# Patient Record
Sex: Female | Born: 1955 | Race: White | Hispanic: No | Marital: Married | Smoking: Never smoker
Health system: Southern US, Community
[De-identification: ages and names within clinical notes are randomized; demographics above are authoritative.]

## PROBLEM LIST (undated history)

## (undated) DIAGNOSIS — E785 Hyperlipidemia, unspecified: Secondary | ICD-10-CM

## (undated) DIAGNOSIS — R079 Chest pain, unspecified: Secondary | ICD-10-CM

## (undated) DIAGNOSIS — M8580 Other specified disorders of bone density and structure, unspecified site: Secondary | ICD-10-CM

## (undated) DIAGNOSIS — T7840XA Allergy, unspecified, initial encounter: Secondary | ICD-10-CM

## (undated) DIAGNOSIS — M1990 Unspecified osteoarthritis, unspecified site: Secondary | ICD-10-CM

## (undated) DIAGNOSIS — R011 Cardiac murmur, unspecified: Secondary | ICD-10-CM

## (undated) DIAGNOSIS — C801 Malignant (primary) neoplasm, unspecified: Secondary | ICD-10-CM

## (undated) HISTORY — PX: ABDOMINAL HYSTERECTOMY: SHX81

## (undated) HISTORY — PX: FOOT SURGERY: SHX648

## (undated) HISTORY — DX: Other specified disorders of bone density and structure, unspecified site: M85.80

## (undated) HISTORY — DX: Chest pain, unspecified: R07.9

## (undated) HISTORY — DX: Unspecified osteoarthritis, unspecified site: M19.90

## (undated) HISTORY — PX: MOHS SURGERY: SHX181

## (undated) HISTORY — PX: JOINT REPLACEMENT: SHX530

## (undated) HISTORY — DX: Allergy, unspecified, initial encounter: T78.40XA

## (undated) HISTORY — DX: Malignant (primary) neoplasm, unspecified: C80.1

## (undated) HISTORY — DX: Cardiac murmur, unspecified: R01.1

## (undated) HISTORY — DX: Hyperlipidemia, unspecified: E78.5

---

## 1998-09-09 ENCOUNTER — Ambulatory Visit (HOSPITAL_COMMUNITY): Admission: RE | Admit: 1998-09-09 | Discharge: 1998-09-09 | Payer: Self-pay | Admitting: Gastroenterology

## 1999-05-12 ENCOUNTER — Other Ambulatory Visit: Admission: RE | Admit: 1999-05-12 | Discharge: 1999-05-12 | Payer: Self-pay | Admitting: Gynecology

## 1999-05-12 ENCOUNTER — Encounter (INDEPENDENT_AMBULATORY_CARE_PROVIDER_SITE_OTHER): Payer: Self-pay | Admitting: Specialist

## 2000-09-01 ENCOUNTER — Other Ambulatory Visit: Admission: RE | Admit: 2000-09-01 | Discharge: 2000-09-01 | Payer: Self-pay | Admitting: Gynecology

## 2000-09-01 ENCOUNTER — Encounter (INDEPENDENT_AMBULATORY_CARE_PROVIDER_SITE_OTHER): Payer: Self-pay | Admitting: Specialist

## 2001-05-03 ENCOUNTER — Other Ambulatory Visit: Admission: RE | Admit: 2001-05-03 | Discharge: 2001-05-03 | Payer: Self-pay | Admitting: Gynecology

## 2001-09-12 ENCOUNTER — Other Ambulatory Visit: Admission: RE | Admit: 2001-09-12 | Discharge: 2001-09-12 | Payer: Self-pay | Admitting: Gynecology

## 2002-01-17 ENCOUNTER — Other Ambulatory Visit: Admission: RE | Admit: 2002-01-17 | Discharge: 2002-01-17 | Payer: Self-pay | Admitting: Gynecology

## 2002-04-18 ENCOUNTER — Inpatient Hospital Stay (HOSPITAL_COMMUNITY): Admission: RE | Admit: 2002-04-18 | Discharge: 2002-04-19 | Payer: Self-pay | Admitting: Gynecology

## 2002-04-18 ENCOUNTER — Encounter (INDEPENDENT_AMBULATORY_CARE_PROVIDER_SITE_OTHER): Payer: Self-pay | Admitting: Specialist

## 2003-02-04 ENCOUNTER — Other Ambulatory Visit: Admission: RE | Admit: 2003-02-04 | Discharge: 2003-02-04 | Payer: Self-pay | Admitting: Gynecology

## 2004-03-08 ENCOUNTER — Other Ambulatory Visit: Admission: RE | Admit: 2004-03-08 | Discharge: 2004-03-08 | Payer: Self-pay | Admitting: Gynecology

## 2005-05-12 ENCOUNTER — Other Ambulatory Visit: Admission: RE | Admit: 2005-05-12 | Discharge: 2005-05-12 | Payer: Self-pay | Admitting: Gynecology

## 2006-05-18 ENCOUNTER — Other Ambulatory Visit: Admission: RE | Admit: 2006-05-18 | Discharge: 2006-05-18 | Payer: Self-pay | Admitting: Gynecology

## 2007-06-14 ENCOUNTER — Other Ambulatory Visit: Admission: RE | Admit: 2007-06-14 | Discharge: 2007-06-14 | Payer: Self-pay | Admitting: Gynecology

## 2009-11-25 ENCOUNTER — Emergency Department (HOSPITAL_COMMUNITY): Admission: EM | Admit: 2009-11-25 | Discharge: 2009-11-25 | Payer: Self-pay | Admitting: Emergency Medicine

## 2009-11-26 ENCOUNTER — Emergency Department (HOSPITAL_COMMUNITY): Admission: EM | Admit: 2009-11-26 | Discharge: 2009-11-26 | Payer: Self-pay | Admitting: Emergency Medicine

## 2010-06-10 LAB — CBC
HCT: 40.7 % (ref 36.0–46.0)
Hemoglobin: 14.4 g/dL (ref 12.0–15.0)
MCHC: 35.3 g/dL (ref 30.0–36.0)
MCV: 87.8 fL (ref 78.0–100.0)
RBC: 4.63 MIL/uL (ref 3.87–5.11)
WBC: 8.2 10*3/uL (ref 4.0–10.5)

## 2010-06-10 LAB — COMPREHENSIVE METABOLIC PANEL
ALT: 27 U/L (ref 0–35)
Albumin: 4.4 g/dL (ref 3.5–5.2)
Alkaline Phosphatase: 63 U/L (ref 39–117)
BUN: 7 mg/dL (ref 6–23)
Chloride: 105 mEq/L (ref 96–112)
Glucose, Bld: 113 mg/dL — ABNORMAL HIGH (ref 70–99)
Sodium: 140 mEq/L (ref 135–145)
Total Bilirubin: 0.9 mg/dL (ref 0.3–1.2)
Total Protein: 7.6 g/dL (ref 6.0–8.3)

## 2010-06-10 LAB — DIFFERENTIAL
Basophils Absolute: 0 10*3/uL (ref 0.0–0.1)
Lymphocytes Relative: 12 % (ref 12–46)

## 2010-06-11 LAB — DIFFERENTIAL
Basophils Absolute: 0 10*3/uL (ref 0.0–0.1)
Basophils Relative: 1 % (ref 0–1)
Eosinophils Relative: 2 % (ref 0–5)
Monocytes Relative: 8 % (ref 3–12)
Neutro Abs: 3.3 10*3/uL (ref 1.7–7.7)
Neutrophils Relative %: 51 % (ref 43–77)

## 2010-06-11 LAB — COMPREHENSIVE METABOLIC PANEL
ALT: 33 U/L (ref 0–35)
AST: 29 U/L (ref 0–37)
Albumin: 4.9 g/dL (ref 3.5–5.2)
Alkaline Phosphatase: 66 U/L (ref 39–117)
BUN: 7 mg/dL (ref 6–23)
CO2: 29 mEq/L (ref 19–32)
Calcium: 10.4 mg/dL (ref 8.4–10.5)
Chloride: 103 mEq/L (ref 96–112)
Creatinine, Ser: 0.77 mg/dL (ref 0.4–1.2)
GFR calc Af Amer: >60 mL/min (ref >60)
GFR calc non Af Amer: >60 mL/min (ref >60)
Potassium: 3.6 mEq/L (ref 3.5–5.1)
Sodium: 141 mEq/L (ref 135–145)

## 2010-06-11 LAB — URINALYSIS, ROUTINE W REFLEX MICROSCOPIC
Bilirubin Urine: NEGATIVE
Protein, ur: NEGATIVE mg/dL
Specific Gravity, Urine: 1.004 — ABNORMAL LOW (ref 1.005–1.030)
Urobilinogen, UA: 0.2 mg/dL (ref 0.0–1.0)
pH: 7 (ref 5.0–8.0)

## 2010-06-11 LAB — CBC
HCT: 43.1 % (ref 36.0–46.0)
MCH: 30.9 pg (ref 26.0–34.0)
MCV: 87.9 fL (ref 78.0–100.0)
RBC: 4.9 MIL/uL (ref 3.87–5.11)
RDW: 13.1 % (ref 11.5–15.5)
WBC: 6.4 10*3/uL (ref 4.0–10.5)

## 2010-08-13 NOTE — H&P (Signed)
NAME:  Shannon Vasquez, Shannon Vasquez                     ACCOUNT NO.:  [id number]   MEDICAL RECORD NO.:  [id number]                   PATIENT TYPE:  INP   LOCATION:  0453                                 FACILITY:  [facility]   PHYSICIAN:  Leatha Gilding. Mezer, M.D.               DATE OF BIRTH:  08/29/1955   DATE OF ADMISSION:  04/18/2002  DATE OF DISCHARGE:                                HISTORY & PHYSICAL   ADMISSION DIAGNOSIS:  Menorrhagia.   HISTORY OF PRESENT ILLNESS:  The patient is a 55 year old gravida 2, para 2  female using vasectomy for contraception who is admitted with longstanding  menorrhagia and menometrorrhagia, history of endometrial hyperplasia, and  history of atypical glandular cells on Pap smear, for a total abdominal  hysterectomy and bilateral salpingo-oophorectomy.  The patient has a long  history of menorrhagia with anemia to a hemoglobin of 8.  The patient  underwent a saline ultrasound in 2001.  Polyps were identified.  A  hysteroscopy D&C was performed, and benign proliferative endometrium and  benign endometrial polyp were identified at that time.  Symptoms persisted,  and the patient underwent a second hysteroscopy in June 2002, at which time  the pathology was returned as a disorder proliferative endometrium with  simple focal hyperplasia without atypia.  In February 2003, the patient had  a Pap smear which showed glandular cell abnormality, atypical glandular  cells that were present, and the comment on the Pap smear was atypical  endocervical cells present.  The patient then underwent a colposcopy with  biopsies which returned no dysplasia in the cervical biopsies, and  endocervical curettage revealed no evidence of glandular atypia.  The  patient next had an endometrial biopsy performed in the office which  revealed benign secretory endometrium.  No hyperplasia or malignancy  identified.  At the time of the atypical glandular cells being diagnosed,  the unpredictable  course of atypical glandular cells was stressed.  It was  possibly related to the previous endometrial hyperplasia.  With a history of  cone biopsy years ago hysterectomy was recommended to the patient.  After  careful consultation she decided at that time that she would consent to  hysterectomy if any further glandular cells were found on future Pap smears  with close observation.  The next Pap smear in June 2003 was returned as  within normal limits, as was the Pap smear in December 2003.  The patient  continued to have very painful, heavy menstrual periods and in December  decided to pursue hysterectomy.  A total abdominal hysterectomy and  bilateral salpingo-oophorectomy have been reviewed with the patient in  detail.  Potential complications including but not limited to injury to the  bowel, bladder, ureters, possible fistula formation, possible blood loss  that would require transfusion and possible sequelae, and possible infection  in the wound or in the pelvis have been discussed with the patient.  The  pros and  cons of bilateral salpingo-oophorectomy versus preserving the  ovaries have been discussed with the patient in detail, and she has elected  to proceed with bilateral salpingo-oophorectomy.  The patient is aware of  the possibility of cervical cancer being diagnosed in the pathology  specimen.  The postoperative expectations and restrictions have been  reviewed in detail, and pain control has been discussed.  The patient  understands that there is no guarantee to eliminate her pelvic pain.  The  patient is, therefore, admitted for total abdominal hysterectomy and  bilateral salpingo-oophorectomy.   PAST SURGICAL HISTORY:  1. Cone biopsy.  2. Diagnostic laparoscopy.  3. Hysteroscopy x 2.   PAST MEDICAL HISTORY:  Noncontributory.   MEDICATIONS:  None.   ALLERGIES:  PENICILLIN (rash, and no breathing difficulties).   SOCIAL HISTORY:  Smokes, none.  ETOH, occasional.   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   FAMILY HISTORY:  Positive for skin cancer in the patient's mother.  Uncle  with non-Hodgkin's lymphoma.  An aunt with questionable lymphoma.   PHYSICAL EXAMINATION:  HEENT:  Negative.  HEART:  Without murmurs.  LUNGS:  Clear.  BREASTS:  Without masses or discharge.  ABDOMEN:  Soft and nontender.  PELVIC:  BUS, vagina, and cervix to be normal, with scarring on the cervix  from previous cone biopsy.  The uterus is anterior, approximately eight  weeks in size, and the adnexa are without masses.  EXTREMITIES:  Negative.   IMPRESSION:  1. Menorrhagia.  2. History of atypical glandular cells on Pap smear.  3. History of endometrial hyperplasia.  4. Pelvic pain.  5. History of cone biopsy.   PLAN:  Total abdominal hysterectomy and bilateral salpingo-oophorectomy.                                               Leatha Gilding. Mezer, M.D.    HCM/MEDQ  D:  04/18/2002  T:  04/18/2002  Job:  [id number]   cc:   C. Duane Lope, M.D.  [address]  [place]  [place] [address]  Fax: [phone]

## 2010-08-13 NOTE — Op Note (Signed)
NAME:  Shannon Vasquez, Shannon Vasquez                     ACCOUNT NO.:  [id number]   MEDICAL RECORD NO.:  [id number]                   PATIENT TYPE:  INP   LOCATION:  0453                                 FACILITY:  [facility]   PHYSICIAN:  Leatha Gilding. Mezer, M.D.               DATE OF BIRTH:  1955-12-03   DATE OF PROCEDURE:  04/18/2002  DATE OF DISCHARGE:  04/19/2002                                 OPERATIVE REPORT   PREOPERATIVE DIAGNOSES:  1. Menorrhagia.  2. Atypical glandular cells on Pap smear.  3. Pelvic pain.   POSTOPERATIVE DIAGNOSES:  1. Menorrhagia.  2. Atypical glandular cells on Pap smear.  3. Pelvic pain.  Pending pathology.   OPERATION PERFORMED:  Total abdominal hysterectomy and bilateral salpingo-  oophorectomy.   SURGEON:  Leatha Gilding. Mezer, M.D.   ASSISTANT:  Leona Singleton, M.D.   ANESTHESIA:  General endotracheal.   PREPARATION:  Betadine.   PROCEDURE:  With the patient in the supine position, she was prepped and  draped in routine fashion.  A Pfannenstiel incision was made through the  skin and subcutaneous tissue.  The fascia and peritoneum were opened without  difficulty.  A brief exploration of her upper abdomen was benign.  Exploration of the pelvis revealed the uterus to be somewhat larger than  topical more size.  The ovaries were essentially normal; there were no signs  of endometriosis and no pelvic adhesions.  The round ligaments were suture  ligated with #1 chromic and divided.  The anterior leaf of the broad  ligament was opened.  The infundibulo pelvic ligaments were isolated.  The  ureters were identified bilaterally and found to be out of harm's way.  The  infundibulo pelvic ligaments were clamped, cut and free tied with #1  chromic; and then suture ligated with #1 chromic.  The bladder was  significantly adherent to the uterus and cervix; likely secondary to the  previous cone biopsy.  The bladder was very carefully taken down sharply and  bluntly and  there was no reason for concern for injury to the bladder.  The  uterine artery and vein were then clamped, cut and suture ligated  bilaterally.  The cardinal ligament was taken in several bites, clamped, cut  and suture ligated with #1 chromic.  The uterosacral ligaments were taken  separately and high; clamped, cut and suture ligated with #1 chromic.  The  vagina was entered laterally on the left side, and the specimen excised with  circumferential dissection.  The cervix was quite irregular and great care  was taken to remove the cervix entirely.  The angles of the vagina were then  sutured with TeLinde sutures of #1 chromic, and the cuff was then whipped  anteriorly and posteriorly with running locked #1 chromic suture.  Great  care was taken to ensure adequate space from the bladder.  The vaginal cuff  opening was then approximated with two interrupted #1  chromic sutures.  Careful attention was paid to hemostasis, and there were several bleeders  primarily on the peritoneal edge of the bladder.  After hemostasis was noted  to be intact, the bladder was then placed over the vaginal cuff with a 3-0  Vicryl suture.  The ureters were reinspected and found to be out of harm's  way.  After thorough irrigation and hemostasis being assured, the large  bowel was placed in the cul-de-sac, the omentum was brought down and the  abdomen was closed in layers using a running 2-0 Vicryl suture on the  peritoneum; running 0 Vicryl to the midline bilaterally on the fascia.  Hemostasis was assured in the subcutaneous tissue, and the skin was closed  with staples.  The sponge, needle and instrument counts were correct x2.  The patient tolerated the procedure well and was taken to the recovery room  in satisfactory condition.                                               Leatha Gilding. Mezer, M.D.    HCM/MEDQ  D:  04/22/2002  T:  04/22/2002  Job:  [id number]   cc:   Leona Singleton, M.D.  [address]  [address] [place]  [place] [address]  Fax: [phone]   Miguel Aschoff, M.D.  [address]  [place]  [place]  [address]  Fax: [phone]

## 2013-04-05 ENCOUNTER — Ambulatory Visit (INDEPENDENT_AMBULATORY_CARE_PROVIDER_SITE_OTHER): Payer: BC Managed Care – PPO

## 2013-04-05 ENCOUNTER — Encounter: Payer: Self-pay | Admitting: Podiatrist

## 2013-04-05 ENCOUNTER — Ambulatory Visit (INDEPENDENT_AMBULATORY_CARE_PROVIDER_SITE_OTHER): Payer: BC Managed Care – PPO | Admitting: Podiatrist

## 2013-04-05 VITALS — BP 129/83 | HR 88 | Resp 18

## 2013-04-05 DIAGNOSIS — M722 Plantar fascial fibromatosis: Secondary | ICD-10-CM

## 2013-04-05 DIAGNOSIS — M79609 Pain in unspecified limb: Secondary | ICD-10-CM

## 2013-04-05 MED ORDER — TRIAMCINOLONE ACETONIDE 40 MG/ML IJ SUSP
20.0000 mg | Freq: Once | INTRAMUSCULAR | Status: AC
  Administered 2013-04-05: 20 mg

## 2013-04-05 MED ORDER — MELOXICAM 15 MG PO TABS: 15.0000 mg | ORAL_TABLET | Freq: Every day | ORAL | Status: DC

## 2013-04-05 NOTE — Progress Notes (Signed)
   Subjective:    Patient ID: Shannon Vasquez, female    DOB: 01-28-1956, 58 y.o.   MRN: [id number]  HPI  I have some pain on my right heel and hurts on the bottom of the heel and been going on for about 2 months and my big toe is numb and hurts to wear shoes and I do feel some pulling.  Patient presents today for followup of plantar fasciitis heel pain on the right side. She states the injections for the last visit were beneficial however the heel pain has returned.    Review of Systems  Constitutional: Negative.   HENT: Negative.   Eyes: Negative.   Cardiovascular: Negative.   Gastrointestinal: Negative.   Endocrine: Negative.   Genitourinary: Negative.   Musculoskeletal: Negative.   Skin: Negative.   Allergic/Immunologic: Negative.   Neurological: Negative.   Hematological: Negative.   Psychiatric/Behavioral: Negative.        Objective:   Physical Exam  Her vascular status is intact and unchanged from the last visit. Pain on palpation plantar medial aspect is present consistent with plantar fasciitis and bursitis. Neurological sensation is intact and negative Tinel sign is elicited. Musculoskeletal examination reveals good muscle strength, tone, stability bilateral. Rectus foot type is noted.      Assessment & Plan:  Plantar fasciitis right Discussed etiology, pathology, conservative vs. Surgical therapies and at this time a plantar fascial injection was recommended.  The patient agreed and a sterile skin prep was applied.  An injection consisting of kenalog and marcaine mixture was infiltrated at the point of maximal tenderness on the right Heel.  The patient tolerated this well and was given instructions for aftercare.

## 2013-04-05 NOTE — Patient Instructions (Signed)

## 2013-04-09 ENCOUNTER — Telehealth: Payer: Self-pay | Admitting: *Deleted

## 2013-04-09 NOTE — Telephone Encounter (Signed)
Patient returned my call. I informed her that Dr. Valentina Lucks prescribed her a prescription for Mobic 15mg .  I advised she can come by to pick it up.  She requested that I mail the prescription to her because she lives too far away.

## 2013-04-09 NOTE — Telephone Encounter (Signed)
I left the patient a message to call me back about her prescription.  Patient did not receive her prescription that was printed on 04/05/13 during her visit.

## 2016-01-04 ENCOUNTER — Encounter: Payer: BC Managed Care – PPO | Attending: Family Medicine | Admitting: Dietician

## 2016-01-04 DIAGNOSIS — Z713 Dietary counseling and surveillance: Secondary | ICD-10-CM | POA: Insufficient documentation

## 2016-01-04 DIAGNOSIS — E785 Hyperlipidemia, unspecified: Secondary | ICD-10-CM

## 2016-01-04 NOTE — Patient Instructions (Signed)
Try Premier Protein shake for meal with fruit.  Try adding some vegetables to lunch meal (salad or raw vegetables). For protein options try:  Tempeh   Celebration Roast Dietitian)  Garden (Chicken Scallopini)   Target brand vegetarian meats  Beyond meat (burger)  Try Better than Boullion for seasoning.

## 2016-01-04 NOTE — Progress Notes (Signed)
  Medical Nutrition Therapy:  Appt start time: [id number] end time:  1715.   Assessment:  Primary concerns today: Shannon Vasquez is here today since she has been trying to lose weight and reduce cholesterol levels. Has been taking Crestor for about 4 years. Weight has been slowly going up each year. Would like to lose 25 lbs.Has not been eating meat for health reasons since May.   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 States that he does some of the shopping and she does the meal preparation. She does not skip meals as much during the summer as during the school year. Eats small meals when at work. Is really busy with school year and struggling with time management. Eats out about 2 x week.     Struggling with ideas for meals. Still fixes meat for husband.   When she is at work she is not very hungry but she is busy. Feels "starving" when she gets home.  Preferred Learning Style:   No preference indicated   Learning Readiness:   Ready  MEDICATIONS: see list   DIETARY INTAKE:  Usual eating pattern includes 3 meals and 2 snacks per day.  Avoided foods include: meats  24-hr recall:  B ( AM): 1/2 peanut butter and jelly or sometimes biscuits and gravy or eggs on weekends  Snk ( AM): none L ( PM): 1/2 peanut butter and jelly, on Sunday Indian food on weekend (large meal) Snk ( PM): fig bar with coffee  D ( PM): fried fish or bean burritos with vegetables, soups or cheese and bread on Sunday Snk ( PM): popcorn or cereal Beverages: water with lemon and diet soda, and black coffee   Usual physical activity: walks at work for job  Estimated energy needs: 1200-1400 calories 135-158 g carbohydrates 90-105 g protein 33-39 g fat  Progress Towards Goal(s):  In progress.   Nutritional Diagnosis:  NB-1.1 Food and nutrition-related knowledge deficit As related to unstructured meals/large portions at night.  As evidenced by diet recall.    Intervention:   Nutrition counseling provided. Plan: Try Premier Protein shake for meal with fruit.  Try adding some vegetables to lunch meal (salad or raw vegetables). For protein options try:  Tempeh   Celebration Roast Dietitian)  Garden (Chicken Scallopini)   Target brand vegetarian meats  Beyond meat (burger)  Try Better than Boullion for seasoning.  Teaching Method Utilized:  Visual Auditory Hands on  Handouts given during visit include:  Vegetarian Proteins  Meal Card  15 g CHO Snacks  Barriers to learning/adherence to lifestyle change: busy work days/not much time to eat  Demonstrated degree of understanding via:  Teach Back   Monitoring/Evaluation:  Dietary intake, exercise, and body weight prn.

## 2016-04-03 ENCOUNTER — Emergency Department (HOSPITAL_COMMUNITY): Payer: BC Managed Care – PPO

## 2016-04-03 ENCOUNTER — Encounter (HOSPITAL_COMMUNITY): Payer: Self-pay

## 2016-04-03 ENCOUNTER — Emergency Department (HOSPITAL_COMMUNITY)
Admission: EM | Admit: 2016-04-03 | Discharge: 2016-04-03 | Disposition: A | Discharge status: Home / Self Care | Payer: BC Managed Care – PPO | Attending: Emergency Medicine | Admitting: Emergency Medicine

## 2016-04-03 DIAGNOSIS — H9202 Otalgia, left ear: Secondary | ICD-10-CM | POA: Diagnosis not present

## 2016-04-03 DIAGNOSIS — H9201 Otalgia, right ear: Secondary | ICD-10-CM

## 2016-04-03 DIAGNOSIS — R079 Chest pain, unspecified: Secondary | ICD-10-CM

## 2016-04-03 LAB — I-STAT TROPONIN, ED
TROPONIN I, POC: 0 ng/mL (ref 0.00–0.08)
Troponin i, poc: 0 ng/mL (ref 0.00–0.08)

## 2016-04-03 LAB — CBC
HCT: 39.4 % (ref 36.0–46.0)
Hemoglobin: 13 g/dL (ref 12.0–15.0)
MCH: 27.7 pg (ref 26.0–34.0)
MCHC: 33 g/dL (ref 30.0–36.0)
MCV: 84 fL (ref 78.0–100.0)
PLATELETS: 324 10*3/uL (ref 150–400)
RBC: 4.69 MIL/uL (ref 3.87–5.11)
RDW: 13.2 % (ref 11.5–15.5)
WBC: 4.9 10*3/uL (ref 4.0–10.5)

## 2016-04-03 LAB — BASIC METABOLIC PANEL
Anion gap: 8 (ref 5–15)
BUN: 14 mg/dL (ref 6–20)
CALCIUM: 9.2 mg/dL (ref 8.9–10.3)
CHLORIDE: 107 mmol/L (ref 101–111)
CO2: 25 mmol/L (ref 22–32)
CREATININE: 0.59 mg/dL (ref 0.44–1.00)
Glucose, Bld: 103 mg/dL — ABNORMAL HIGH (ref 65–99)
Potassium: 4 mmol/L (ref 3.5–5.1)
SODIUM: 140 mmol/L (ref 135–145)

## 2016-04-03 MED ORDER — IOPAMIDOL (ISOVUE-370) INJECTION 76%
INTRAVENOUS | Status: AC
  Administered 2016-04-03: 100 mL
  Filled 2016-04-03: qty 100

## 2016-04-03 NOTE — Discharge Instructions (Signed)
The cause of your symptoms was not identified today. Please follow up with your family doctor for recheck. Please get rechecked immediately if you develop any new or worrisome symptoms.    EXAM: CT ANGIOGRAPHY CHEST, ABDOMEN AND PELVIS   TECHNIQUE: Initially, axial CT images were obtained through the chest without intravenous contrast material administration. Multidetector CT imaging through the chest, abdomen and pelvis was performed using the standard protocol during bolus administration of intravenous contrast. Multiplanar reconstructed images and MIPs were obtained and reviewed to evaluate the vascular anatomy.   CONTRAST:  100 mL Isovue 370 nonionic   COMPARISON:  Chest radiograph April 03, 2016   FINDINGS: CTA CHEST FINDINGS   Cardiovascular: No intramural hematoma is seen in the thoracic region on noncontrast enhanced study. There is no evident thoracic aortic aneurysm or dissection. The visualized great vessels appear unremarkable. The pericardium is not appreciably thickened. There is no evident pulmonary embolus.   Mediastinum/Nodes: Visualized thyroid appears unremarkable. There is no appreciable thoracic adenopathy.   Lungs/Pleura: There is minimal atelectatic change in the anterior left base. There is no edema or consolidation. No appreciable pleural effusion or pleural thickening.   Musculoskeletal: There are no blastic or lytic bone lesions. There is slight mid thoracic dextroscoliosis.   Review of the MIP images confirms the above findings.   CTA ABDOMEN AND PELVIS FINDINGS   VASCULAR   Aorta: There is no appreciable thoracic aortic aneurysm or dissection. There is mild atherosclerotic calcification in the mid the distal aorta. No appreciable narrowing of the aorta is evident.   Celiac: Celiac axis and its branches are widely patent. No aneurysm or dissection evident.   SMA: Superior mesenteric artery and its major branches are widely patent. No  aneurysm or dissection.   Renals: Single renal arteries are apparent bilaterally. No aneurysm or dissection. Renal arteries appear widely patent without focal lesion on either side.   IMA: Inferior mesenteric artery and its major branches appear widely patent. No aneurysm or dissection evident.   Inflow: The pelvic arterial vessels show no aneurysm or dissection. There is mild calcification in the distal common iliac arteries bilaterally. There is also slight calcification in each hypogastric artery. There is no appreciable vessel narrowing.   Veins: No apparent venous abnormality within the limitations of this arterial phase study.   Review of the MIP images confirms the above findings.   NON-VASCULAR   Hepatobiliary: Liver measures 18.3 cm in length. There is a degree of hepatic steatosis. No focal liver lesions are demonstrable. Gallbladder wall is not appreciably thickened. There is no biliary duct dilatation.   Pancreas: No pancreatic mass or inflammatory focus.   Spleen: No splenic lesions are evident.   Adrenals/Urinary Tract: Adrenals appear within normal limits bilaterally. There is fetal lobulation in each kidney. There is no appreciable renal mass or hydronephrosis on either side. There is no renal or ureteral calculus on either side. Urinary bladder is midline with wall thickness within normal limits.   Stomach/Bowel: There are scattered sigmoid diverticula without diverticulitis. Rectum is slightly distended with stool. There is no appreciable bowel wall or mesenteric thickening. No bowel obstruction. No free air or portal venous air.   Lymphatic: There is no evident adenopathy in the abdomen or pelvis.   Reproductive: Uterus is absent. There is no pelvic mass or pelvic fluid collection.   Other: Appendix appears normal. No abscess or ascites in the abdomen or pelvis. There is a minimal ventral hernia containing only fat.   Musculoskeletal: There  are no  blastic or lytic bone lesions. No intramuscular or abdominal wall lesion.   Review of the MIP images confirms the above findings.   IMPRESSION: CT angiogram chest: No demonstrable thoracic aortic aneurysm or dissection. No pulmonary embolus. No lung edema or consolidation. No evident adenopathy.   CT angiogram abdomen and pelvis: There is mild aortoiliac atherosclerotic calcification. No appreciable abdominal or pelvic arterial vessel narrowing. No aneurysm or dissection.   Mildly prominent liver with hepatic steatosis. Uterus absent. Minimal ventral hernia containing only fat.   Scattered sigmoid diverticuli diverticulitis. No bowel wall thickening or bowel obstruction. No abscess. Appendix appears normal.

## 2016-04-03 NOTE — ED Triage Notes (Signed)
Pt states she was at church. States she started having pain in rt ear.  Pain is moving.  Now it is in her back.  Feels like it is in the "middle of her body".  Radiates to chest.  Now pain in both ears. Felt fine prior to going to church.  No shortness of breath.

## 2016-04-03 NOTE — ED Provider Notes (Signed)
[facility] DEPT Provider Note   CSN: [id number] Arrival date & time: 04/03/16  1150     History   Chief Complaint Chief Complaint  Patient presents with  . Otalgia  . Chest Pain    HPI Shannon Vasquez is a 61 y.o. female.  The history is provided by the patient. No language interpreter was used.  Otalgia   Chest Pain      Shannon Vasquez is a 61 y.o. female who presents to the Emergency Department complaining of chest pain.  She was sitting in church today when she developed pain that was severe and burning to her right neck. The pain radiated down to her back and wrapped around her right chest. Followed by severe diffuse chest burning. The severe nature of her pain lasted about 10 minutes and migrated over the course of 10 minutes. After that the pain gradually resolved. No associated fevers, headaches, cough, shortness of breath, abdominal pain, nausea, vomiting, numbness, weakness. She currently has no pain. She has a history of hyperlipidemia. No history of blood clots and she does not take any hormones. No recent surgeries. She has a family history of hypertension and peripheral vascular disease.  Past Medical History:  Diagnosis Date  . Allergy   . Arthritis   . Cancer ([place])    carcinoma  . Heart murmur   . Hyperlipidemia     There are no active problems to display for this patient.   Past Surgical History:  Procedure Laterality Date  . ABDOMINAL HYSTERECTOMY    . FOOT SURGERY    . MOHS SURGERY     on lip    OB History    No data available       Home Medications    Prior to Admission medications   Medication Sig Start Date End Date Taking? Authorizing Provider  diphenhydrAMINE (BENADRYL) 25 MG tablet Take 25 mg by mouth every 6 (six) hours as needed. Does the benadryl every day and  1 1/2 tablets and doesn't know the mg/lc    Historical Provider, MD  meloxicam (MOBIC) 15 MG tablet Take 1 tablet (15 mg total) by mouth daily. 04/05/13   Bronson Ing, DPM  montelukast (SINGULAIR) 10 MG tablet  03/25/13   Historical Provider, MD  rosuvastatin (CRESTOR) 10 MG tablet Take 10 mg by mouth daily.    Historical Provider, MD    Family History History reviewed. No pertinent family history.  Social History Social History  Substance Use Topics  . Smoking status: Never Smoker  . Smokeless tobacco: Never Used  . Alcohol use Yes     Allergies   Penicillins   Review of Systems Review of Systems  HENT: Positive for ear pain.   Cardiovascular: Positive for chest pain.  All other systems reviewed and are negative.    Physical Exam Updated Vital Signs BP 142/92   Pulse 77   Temp 97.8 F (36.6 C) (Oral)   Resp 16   Ht 5\' 4"  (1.626 m)   Wt 156 lb (70.8 kg)   SpO2 95%   BMI 26.78 kg/m   Physical Exam  Constitutional: She is oriented to person, place, and time. She appears well-developed and well-nourished. No distress.  HENT:  Head: Normocephalic and atraumatic.  Right Ear: External ear normal.  Left Ear: External ear normal.  Neck: Neck supple.  Cardiovascular: Normal rate and regular rhythm.   No murmur heard. Pulmonary/Chest: Effort normal and breath sounds normal. No respiratory distress.  Abdominal: Soft. There is no tenderness. There is no rebound and no guarding.  Musculoskeletal: She exhibits no edema or tenderness.  2+ radial pulses bilaterally  Lymphadenopathy:    She has no cervical adenopathy.  Neurological: She is alert and oriented to person, place, and time.  Skin: Skin is warm and dry.  Psychiatric: She has a normal mood and affect. Her behavior is normal.  Nursing note and vitals reviewed.    ED Treatments / Results  Labs (all labs ordered are listed, but only abnormal results are displayed) Labs Reviewed  BASIC METABOLIC PANEL - Abnormal; Notable for the following:       Result Value   Glucose, Bld 103 (*)    All other components within normal limits  CBC  I-STAT TROPOININ, ED    I-STAT TROPOININ, ED    EKG  EKG Interpretation  Date/Time:  Sunday April 03 2016 12:05:05 EST Ventricular Rate:  71 PR Interval:    QRS Duration: 87 QT Interval:  402 QTC Calculation: 437 R Axis:   -5 Text Interpretation:  Sinus rhythm Low voltage, precordial leads Consider anterior infarct Confirmed by Hazle Coca [phone]) on 04/03/2016 3:56:11 PM       Radiology Dg Chest 2 View  Result Date: 04/03/2016 CLINICAL DATA:  Chest pain, initial encounter EXAM: CHEST  2 VIEW COMPARISON:  None. FINDINGS: The heart size and mediastinal contours are within normal limits. Both lungs are clear. The visualized skeletal structures are unremarkable. IMPRESSION: No active cardiopulmonary disease. Electronically Signed   By: Inez Catalina M.D.   On: 04/03/2016 12:36   Ct Angio Chest/abd/pel For Dissection W And/or W/wo  Result Date: 04/03/2016 CLINICAL DATA:  Chest and abdominal pain radiating to back region EXAM: CT ANGIOGRAPHY CHEST, ABDOMEN AND PELVIS TECHNIQUE: Initially, axial CT images were obtained through the chest without intravenous contrast material administration. Multidetector CT imaging through the chest, abdomen and pelvis was performed using the standard protocol during bolus administration of intravenous contrast. Multiplanar reconstructed images and MIPs were obtained and reviewed to evaluate the vascular anatomy. CONTRAST:  100 mL Isovue 370 nonionic COMPARISON:  Chest radiograph April 03, 2016 FINDINGS: CTA CHEST FINDINGS Cardiovascular: No intramural hematoma is seen in the thoracic region on noncontrast enhanced study. There is no evident thoracic aortic aneurysm or dissection. The visualized great vessels appear unremarkable. The pericardium is not appreciably thickened. There is no evident pulmonary embolus. Mediastinum/Nodes: Visualized thyroid appears unremarkable. There is no appreciable thoracic adenopathy. Lungs/Pleura: There is minimal atelectatic change in the anterior left  base. There is no edema or consolidation. No appreciable pleural effusion or pleural thickening. Musculoskeletal: There are no blastic or lytic bone lesions. There is slight mid thoracic dextroscoliosis. Review of the MIP images confirms the above findings. CTA ABDOMEN AND PELVIS FINDINGS VASCULAR Aorta: There is no appreciable thoracic aortic aneurysm or dissection. There is mild atherosclerotic calcification in the mid the distal aorta. No appreciable narrowing of the aorta is evident. Celiac: Celiac axis and its branches are widely patent. No aneurysm or dissection evident. SMA: Superior mesenteric artery and its major branches are widely patent. No aneurysm or dissection. Renals: Single renal arteries are apparent bilaterally. No aneurysm or dissection. Renal arteries appear widely patent without focal lesion on either side. IMA: Inferior mesenteric artery and its major branches appear widely patent. No aneurysm or dissection evident. Inflow: The pelvic arterial vessels show no aneurysm or dissection. There is mild calcification in the distal common iliac arteries bilaterally. There is also slight  calcification in each hypogastric artery. There is no appreciable vessel narrowing. Veins: No apparent venous abnormality within the limitations of this arterial phase study. Review of the MIP images confirms the above findings. NON-VASCULAR Hepatobiliary: Liver measures 18.3 cm in length. There is a degree of hepatic steatosis. No focal liver lesions are demonstrable. Gallbladder wall is not appreciably thickened. There is no biliary duct dilatation. Pancreas: No pancreatic mass or inflammatory focus. Spleen: No splenic lesions are evident. Adrenals/Urinary Tract: Adrenals appear within normal limits bilaterally. There is fetal lobulation in each kidney. There is no appreciable renal mass or hydronephrosis on either side. There is no renal or ureteral calculus on either side. Urinary bladder is midline with wall  thickness within normal limits. Stomach/Bowel: There are scattered sigmoid diverticula without diverticulitis. Rectum is slightly distended with stool. There is no appreciable bowel wall or mesenteric thickening. No bowel obstruction. No free air or portal venous air. Lymphatic: There is no evident adenopathy in the abdomen or pelvis. Reproductive: Uterus is absent. There is no pelvic mass or pelvic fluid collection. Other: Appendix appears normal. No abscess or ascites in the abdomen or pelvis. There is a minimal ventral hernia containing only fat. Musculoskeletal: There are no blastic or lytic bone lesions. No intramuscular or abdominal wall lesion. Review of the MIP images confirms the above findings. IMPRESSION: CT angiogram chest: No demonstrable thoracic aortic aneurysm or dissection. No pulmonary embolus. No lung edema or consolidation. No evident adenopathy. CT angiogram abdomen and pelvis: There is mild aortoiliac atherosclerotic calcification. No appreciable abdominal or pelvic arterial vessel narrowing. No aneurysm or dissection. Mildly prominent liver with hepatic steatosis. Uterus absent. Minimal ventral hernia containing only fat. Scattered sigmoid diverticuli diverticulitis. No bowel wall thickening or bowel obstruction. No abscess. Appendix appears normal. Electronically Signed   By: Lowella Grip III M.D.   On: 04/03/2016 16:49    Procedures Procedures (including critical care time)  Medications Ordered in ED Medications  iopamidol (ISOVUE-370) 76 % injection (100 mLs  Contrast Given 04/03/16 1625)     Initial Impression / Assessment and Plan / ED Course  I have reviewed the triage vital signs and the nursing notes.  Pertinent labs & imaging results that were available during my care of the patient were reviewed by me and considered in my medical decision making (see chart for details).  Clinical Course     Patient here following an episode of chest pain and lateral neck pain,  resolved in the emergency department. She is in no distress. Current clinical picture is not consistent with ACS, PE, CHF, pneumonia. No evidence of dissection. Discussed with patient unclear source of symptoms. Recommend outpatient follow-up as well as close return precautions.  Final Clinical Impressions(s) / ED Diagnoses   Final diagnoses:  Right ear pain  Chest pain, unspecified type    New Prescriptions Discharge Medication List as of 04/03/2016  5:29 PM       Quintella Reichert, MD 04/04/16 [phone]

## 2016-04-03 NOTE — ED Notes (Signed)
Patient transported to CT 

## 2020-06-12 ENCOUNTER — Telehealth: Payer: Self-pay

## 2020-06-12 NOTE — Telephone Encounter (Signed)
Referral notes sent from [facility], Dr. Barkley Bruns office, Phone #: [phone], Fax #: [phone]   Notes sent to scheduling

## 2020-06-16 ENCOUNTER — Other Ambulatory Visit: Payer: Self-pay | Admitting: Obstetrics & Gynecology

## 2020-06-16 DIAGNOSIS — N644 Mastodynia: Secondary | ICD-10-CM

## 2020-08-11 ENCOUNTER — Ambulatory Visit: Payer: BC Managed Care – PPO | Admitting: Cardiology

## 2020-08-11 ENCOUNTER — Other Ambulatory Visit: Payer: Self-pay

## 2020-08-11 ENCOUNTER — Encounter: Payer: Self-pay | Admitting: Cardiology

## 2020-08-11 VITALS — BP 130/80 | HR 84 | Ht 64.0 in | Wt 155.0 lb

## 2020-08-11 DIAGNOSIS — E782 Mixed hyperlipidemia: Secondary | ICD-10-CM

## 2020-08-11 DIAGNOSIS — Z8249 Family history of ischemic heart disease and other diseases of the circulatory system: Secondary | ICD-10-CM | POA: Diagnosis not present

## 2020-08-11 DIAGNOSIS — R079 Chest pain, unspecified: Secondary | ICD-10-CM | POA: Diagnosis not present

## 2020-08-11 NOTE — Patient Instructions (Signed)
Medication Instructions:  The current medical regimen is effective;  continue present plan and medications.  *If you need a refill on your cardiac medications before your next appointment, please call your pharmacy*  Testing/Procedures: Your physician has requested that you have an echocardiogram. Echocardiography is a painless test that uses sound waves to create images of your heart. It provides your doctor with information about the size and shape of your heart and how well your heart's chambers and valves are working. This procedure takes approximately one hour. There are no restrictions for this procedure.  Your physician has requested that you have Coronary Calcium score which is completed by  CT. Cardiac computed tomography (CT) is a painless test that uses an x-ray machine to take clear, detailed pictures of your heart. This testing is completed here at this office.  There are no prior instructions.  The cost of the testing is $99 and due at the time of the procedure.  Follow-Up: At [facility], you and your health needs are our priority.  As part of our continuing mission to provide you with exceptional heart care, we have created designated Provider Care Teams.  These Care Teams include your primary Cardiologist (physician) and Advanced Practice Providers (APPs -  Physician Assistants and Nurse Practitioners) who all work together to provide you with the care you need, when you need it.  We recommend signing up for the patient portal called "MyChart".  Sign up information is provided on this After Visit Summary.  MyChart is used to connect with patients for Virtual Visits (Telemedicine).  Patients are able to view lab/test results, encounter notes, upcoming appointments, etc.  Non-urgent messages can be sent to your provider as well.   To learn more about what you can do with MyChart, go to [email or web address].    Your next appointment:   1 year(s)  The format for your next  appointment:   In Person  Provider:   Candee Furbish, MD   Thank you for choosing [facility]!!

## 2020-08-11 NOTE — Progress Notes (Signed)
Cardiology Office Note:    Date:  08/11/2020   ID:  Shannon Vasquez, DOB 1955/08/02, MRN [id number]  PCP:  Lawerance Cruel, MD   [facility] Providers Cardiologist:  None     Referring MD: Linda Hedges, DO     History of Present Illness:    Shannon Vasquez is a 65 y.o. female here for evaluation of chest pain at the request of Dr. Linda Hedges.  In review of outside office notes, complained of chest and upper back pain for about 3 weeks. Not a problem lately. Felt some heaviness. Back she thinks is more MSK. Feels better. Mild shortness of breath. Weight loss helped.   Mother has PAD, CVA  Feels better off Crestor.   We see her husband Dominica Severin  Past Medical History:  Diagnosis Date  . Allergy   . Arthritis   . Arthritis   . Cancer ([place])    carcinoma  . Chest pain   . Heart murmur   . Hyperlipidemia   . Osteopenia     Past Surgical History:  Procedure Laterality Date  . ABDOMINAL HYSTERECTOMY    . FOOT SURGERY    . MOHS SURGERY     on lip    Current Medications: Current Meds  Medication Sig  . Cholecalciferol (VITAMIN D3) 25 MCG (1000 UT) CAPS Take 1 capsule by mouth daily.  Marland Kitchen ibuprofen (ADVIL) 200 MG tablet Take 1 tablet by mouth 3 times/day as needed-between meals & bedtime.  . Multiple Vitamin (ONE DAILY) tablet Take 1 tablet by mouth daily.  Marland Kitchen NEXLIZET 180-10 MG TABS Take 1 tablet by mouth daily.     Allergies:   Penicillins   Social History   Socioeconomic History  . Marital status: Married    Spouse name: Not on file  . Number of children: Not on file  . Years of education: Not on file  . Highest education level: Not on file  Occupational History  . Not on file  Tobacco Use  . Smoking status: Never Smoker  . Smokeless tobacco: Never Used  Substance and Sexual Activity  . Alcohol use: Yes  . Drug use: No  . Sexual activity: Not on file  Other Topics Concern  . Not on file  Social History Narrative  . Not on file   Social  Determinants of Health   Financial Resource Strain: Not on file  Food Insecurity: Not on file  Transportation Needs: Not on file  Physical Activity: Not on file  Stress: Not on file  Social Connections: Not on file     Family History: The patient's family history includes Arthritis in her father, maternal grandfather, maternal grandmother, mother, paternal grandfather, and paternal grandmother; Heart disease in her maternal grandfather and paternal grandfather; Other in her paternal grandmother; Thyroid disease in her mother.  ROS:   Please see the history of present illness.     All other systems reviewed and are negative.  EKGs/Labs/Other Studies Reviewed:    The following studies were reviewed today:   EKG:  EKG is  ordered today.  The ekg ordered today demonstrates sinus rhythm 84, left axis deviation  Recent Labs: No results found for requested labs within last 8760 hours.  Recent Lipid Panel No results found for: CHOL, TRIG, HDL, CHOLHDL, VLDL, LDLCALC, LDLDIRECT   Risk Assessment/Calculations:      Physical Exam:    VS:  BP 130/80 (BP Location: Left Arm, Patient Position: Sitting, Cuff Size: Normal)   Pulse  84   Ht 5\' 4"  (1.626 m)   Wt 155 lb (70.3 kg)   SpO2 96%   BMI 26.61 kg/m     Wt Readings from Last 3 Encounters:  08/11/20 155 lb (70.3 kg)  04/03/16 156 lb (70.8 kg)  01/04/16 160 lb 8 oz (72.8 kg)     GEN:  Well nourished, well developed in no acute distress HEENT: Normal NECK: No JVD; No carotid bruits LYMPHATICS: No lymphadenopathy CARDIAC: RRR, 1/6 SM no rubs, gallops RESPIRATORY:  Clear to auscultation without rales, wheezing or rhonchi  ABDOMEN: Soft, non-tender, non-distended MUSCULOSKELETAL:  No edema; No deformity  SKIN: Warm and dry NEUROLOGIC:  Alert and oriented x 3 PSYCHIATRIC:  Normal affect   ASSESSMENT:    1. Chest pain of uncertain etiology   2. Family history of early CAD   3. Mixed hyperlipidemia    PLAN:    In order  of problems listed above:  Chest pain - Currently, she is not having any further chest discomfort.  Feels better.  Her back pain most likely was musculoskeletal and has resolved with some exercises and stretching.  Obviously if symptoms were to return or become more worrisome we would have a low threshold for further testing such as stress test.  Family history of PAD, CAD - We will go ahead and check a coronary calcium score, $99 test.  If significant coronary artery calcification is present, this may change medication management or warrant further testing.  Heart murmur - 1/6 systolic murmur heard right upper sternal border.  We will check echocardiogram.  Several years ago she was told that she has a murmur.  Hyperlipidemia - Was on Crestor 10 mg once a day.  Since she was having some muscle discomfort, she stopped this medication.  Recently changed to Nexlizet 180 mg - 10 mg which is bempedoic acid and Zetia.  Tolerating this well.  LDL on 06/03/2020 was 128.  Creatinine 0.8 hemoglobin 14.0 ALT 43 TSH 2.3   1 yr f/u  Medication Adjustments/Labs and Tests Ordered: Current medicines are reviewed at length with the patient today.  Concerns regarding medicines are outlined above.  Orders Placed This Encounter  Procedures  . CT CARDIAC SCORING (SELF PAY ONLY)  . EKG 12-Lead  . ECHOCARDIOGRAM COMPLETE   No orders of the defined types were placed in this encounter.   Patient Instructions  Medication Instructions:  The current medical regimen is effective;  continue present plan and medications.  *If you need a refill on your cardiac medications before your next appointment, please call your pharmacy*  Testing/Procedures: Your physician has requested that you have an echocardiogram. Echocardiography is a painless test that uses sound waves to create images of your heart. It provides your doctor with information about the size and shape of your heart and how well your heart's chambers and  valves are working. This procedure takes approximately one hour. There are no restrictions for this procedure.  Your physician has requested that you have Coronary Calcium score which is completed by  CT. Cardiac computed tomography (CT) is a painless test that uses an x-ray machine to take clear, detailed pictures of your heart. This testing is completed here at this office.  There are no prior instructions.  The cost of the testing is $99 and due at the time of the procedure.  Follow-Up: At [facility], you and your health needs are our priority.  As part of our continuing mission to provide you with exceptional  heart care, we have created designated Provider Care Teams.  These Care Teams include your primary Cardiologist (physician) and Advanced Practice Providers (APPs -  Physician Assistants and Nurse Practitioners) who all work together to provide you with the care you need, when you need it.  We recommend signing up for the patient portal called "MyChart".  Sign up information is provided on this After Visit Summary.  MyChart is used to connect with patients for Virtual Visits (Telemedicine).  Patients are able to view lab/test results, encounter notes, upcoming appointments, etc.  Non-urgent messages can be sent to your provider as well.   To learn more about what you can do with MyChart, go to [email or web address].    Your next appointment:   1 year(s)  The format for your next appointment:   In Person  Provider:   Candee Furbish, MD   Thank you for choosing [facility]!!        Signed, Candee Furbish, MD  08/11/2020 3:20 PM    [place]

## 2020-08-13 ENCOUNTER — Other Ambulatory Visit: Payer: Self-pay

## 2020-08-13 ENCOUNTER — Ambulatory Visit
Admission: RE | Admit: 2020-08-13 | Discharge: 2020-08-13 | Disposition: A | Discharge status: Home / Self Care | Payer: BC Managed Care – PPO | Source: Ambulatory Visit | Attending: Obstetrics & Gynecology | Admitting: Obstetrics & Gynecology

## 2020-08-13 ENCOUNTER — Ambulatory Visit: Payer: BC Managed Care – PPO

## 2020-08-13 ENCOUNTER — Other Ambulatory Visit: Payer: Self-pay | Admitting: Obstetrics & Gynecology

## 2020-08-13 DIAGNOSIS — N644 Mastodynia: Secondary | ICD-10-CM

## 2020-09-11 ENCOUNTER — Ambulatory Visit (INDEPENDENT_AMBULATORY_CARE_PROVIDER_SITE_OTHER)
Admission: RE | Admit: 2020-09-11 | Discharge: 2020-09-11 | Disposition: A | Discharge status: Home / Self Care | Payer: Self-pay | Source: Ambulatory Visit | Attending: Cardiology | Admitting: Cardiology

## 2020-09-11 ENCOUNTER — Other Ambulatory Visit: Payer: Self-pay

## 2020-09-11 ENCOUNTER — Ambulatory Visit (HOSPITAL_COMMUNITY): Payer: BC Managed Care – PPO | Attending: Cardiology

## 2020-09-11 DIAGNOSIS — R079 Chest pain, unspecified: Secondary | ICD-10-CM | POA: Insufficient documentation

## 2020-09-11 LAB — ECHOCARDIOGRAM COMPLETE
Area-P 1/2: 3.85 cm2
S' Lateral: 2.2 cm

## 2022-03-09 IMAGING — MG MM DIGITAL DIAGNOSTIC UNILAT*R* W/ TOMO W/ CAD
2 series · 3 of 6 positions shown · non-contrast
Comparison: Previous exams including most recent bilateral
screening mammogram dated 06/10/2020.

CLINICAL DATA: Mastodynia. Intermittent tingling within the inner
RIGHT breast.

EXAM:
DIGITAL DIAGNOSTIC UNILATERAL RIGHT MAMMOGRAM WITH TOMOSYNTHESIS AND
CAD
TECHNIQUE: Right digital diagnostic mammography and breast tomosynthesis was
performed. The images were evaluated with computer-aided detection.

[R TAN synth-2D]
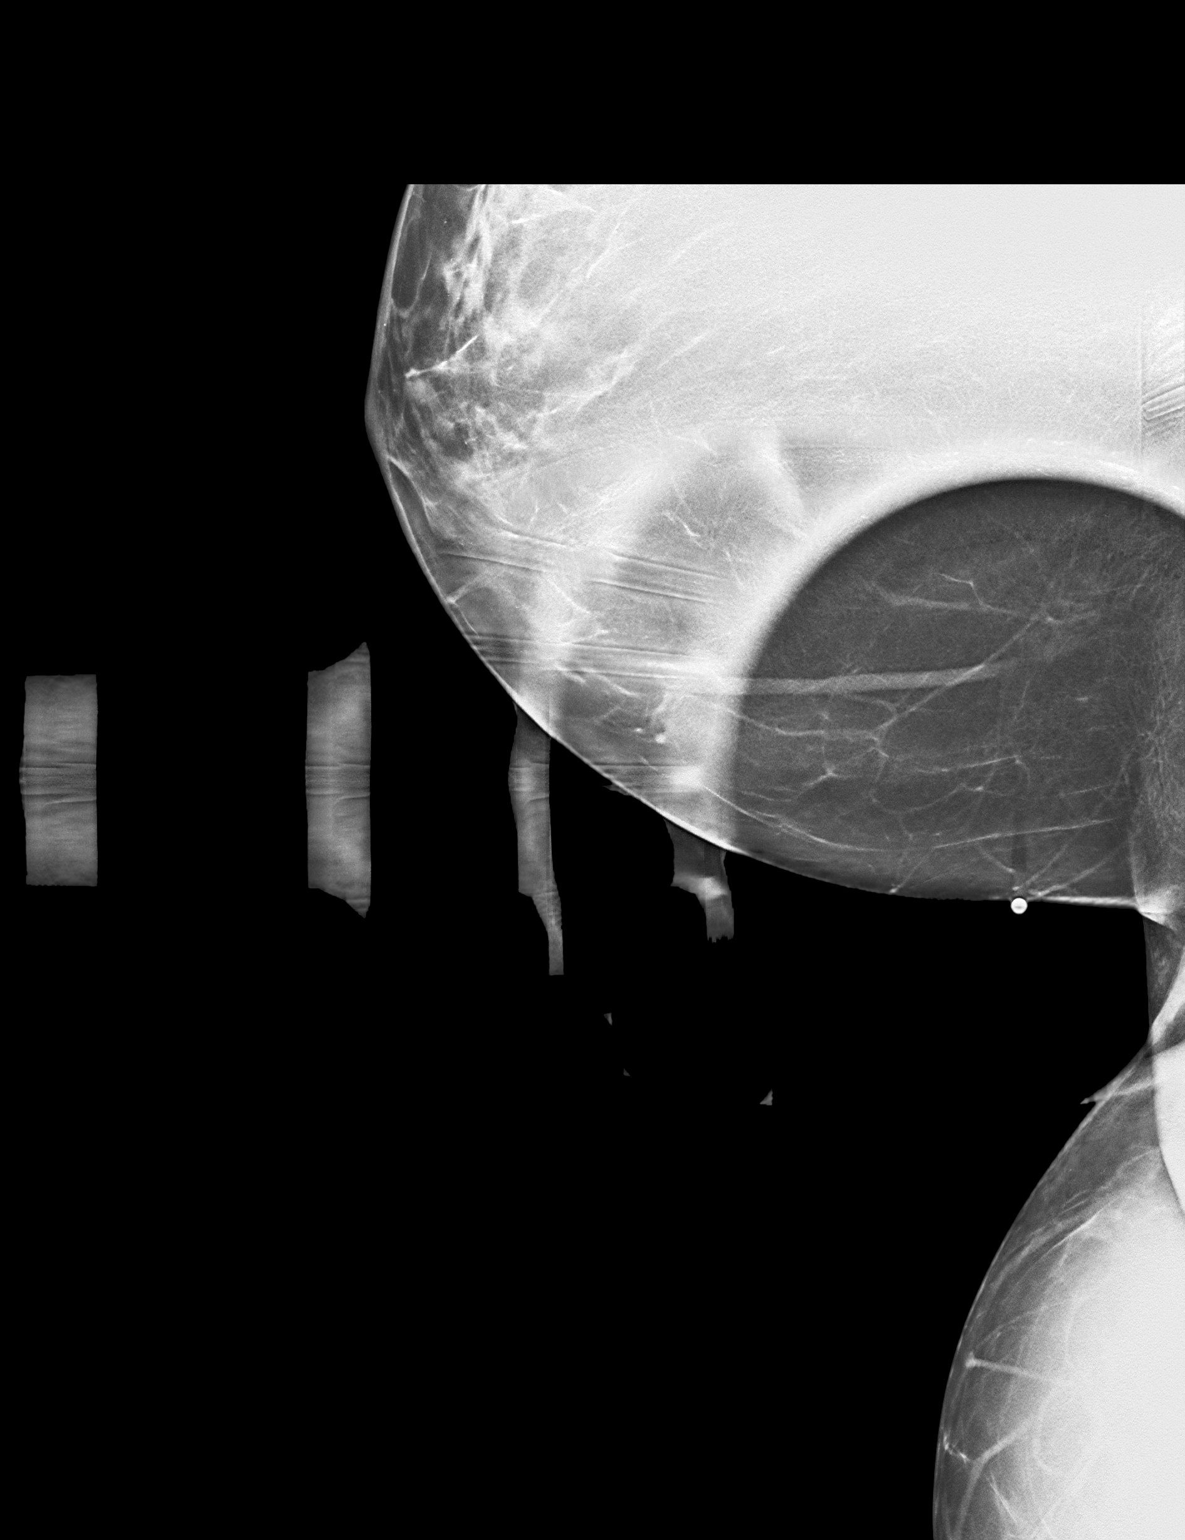

[R TAN tomo · 2 of 57 frames shown]
[frame 19/57]
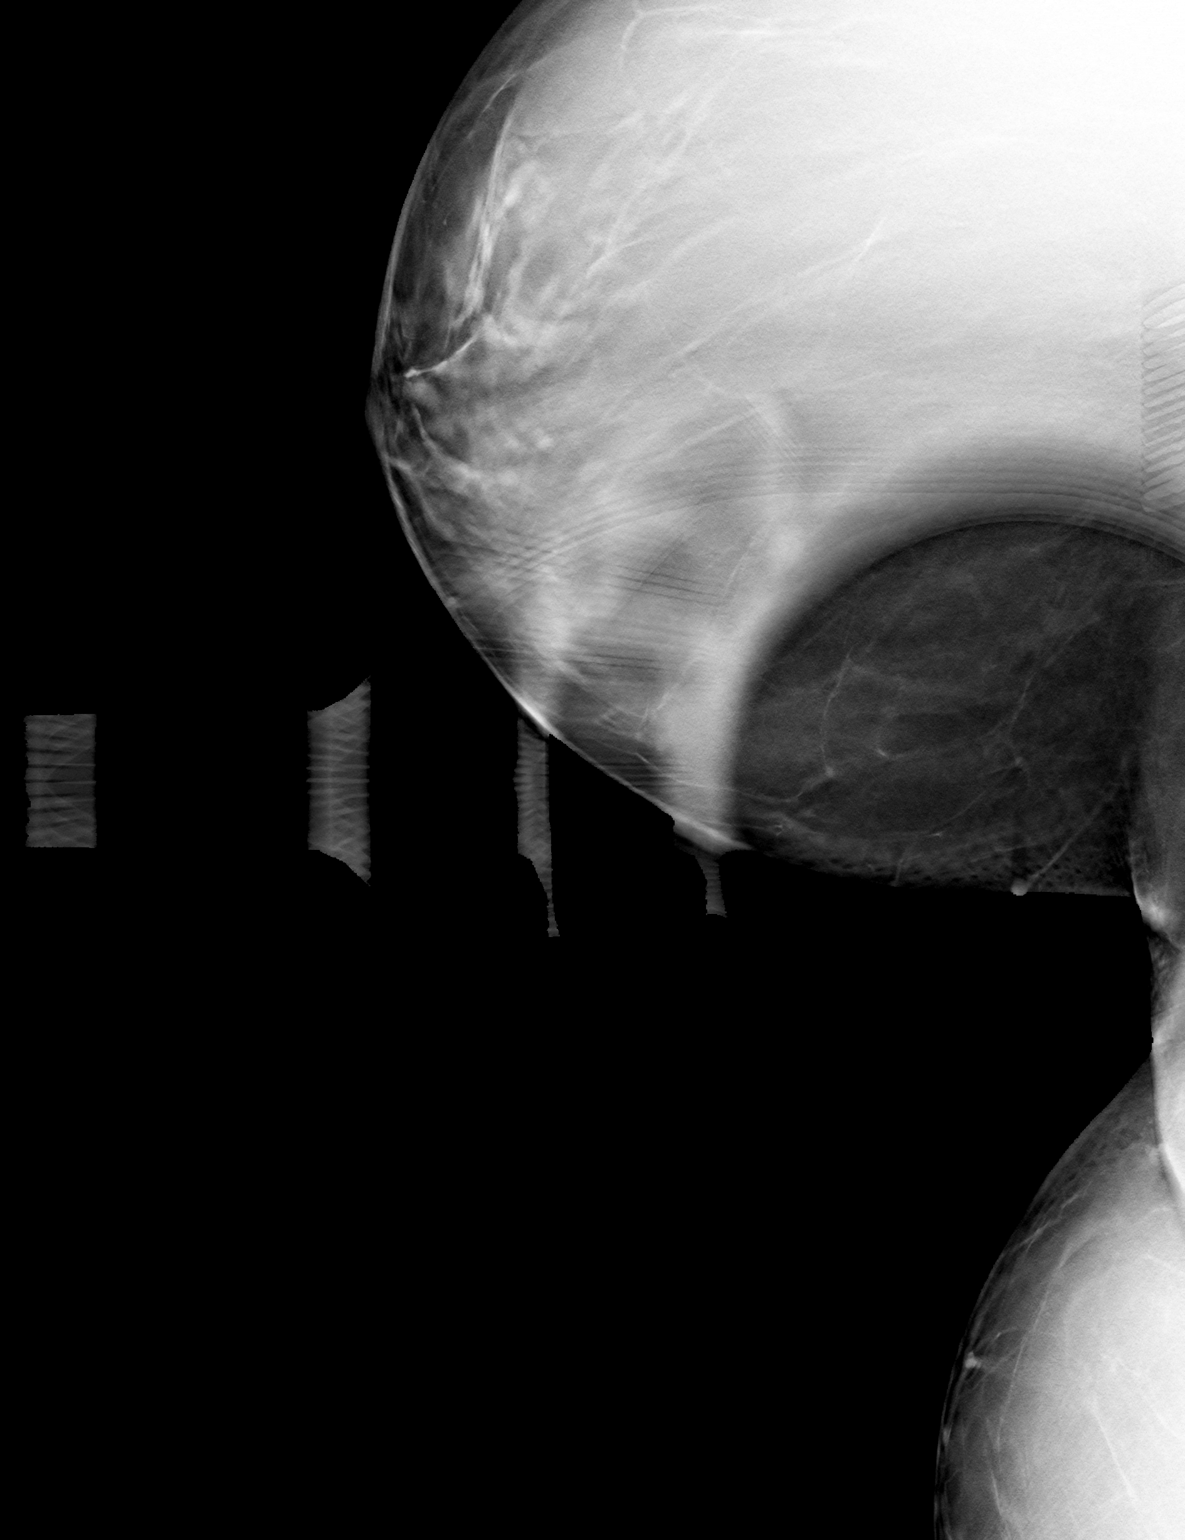
[frame 29/57]
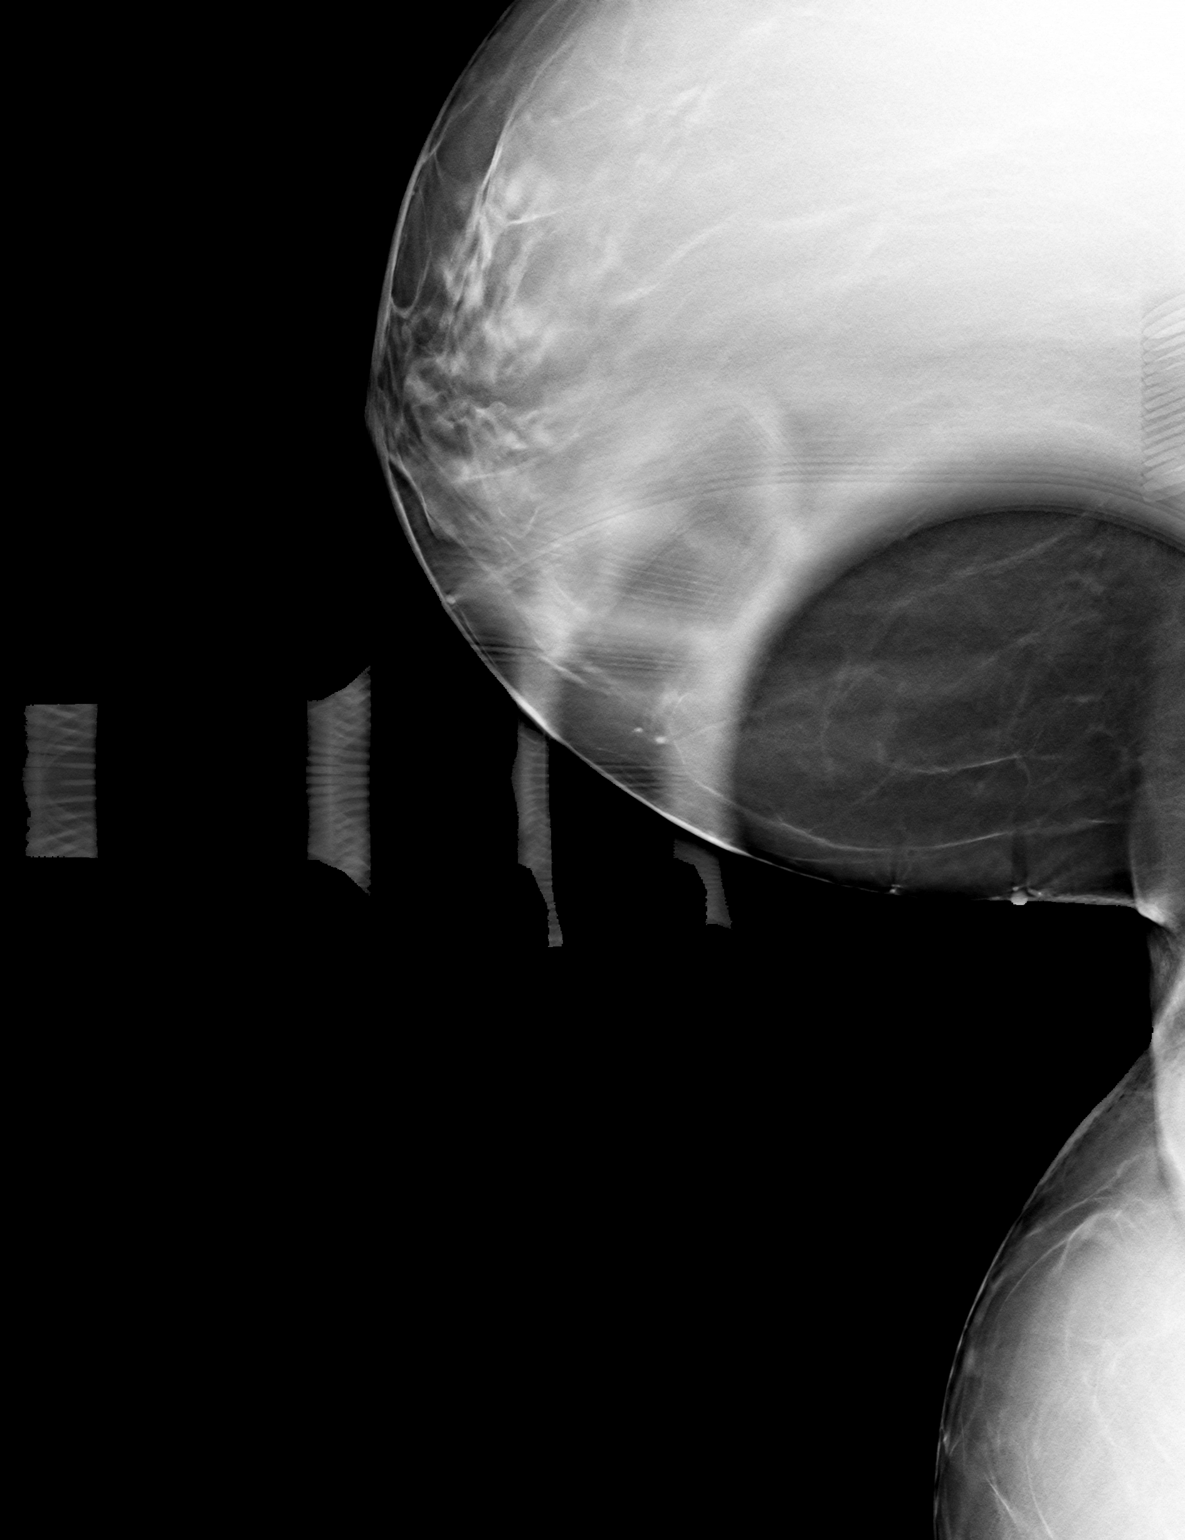

[3 of 6 positions shown; findings below may reference images not displayed]

ACR Breast Density Category b: There are scattered areas of
fibroglandular density.
FINDINGS: On today's additional diagnostic exam, there is no mammographic
abnormality within the inner RIGHT breast, corresponding to the area
of clinical concern.
IMPRESSION: No evidence of malignancy.

RECOMMENDATION:
1. Annual screening mammograms. Next bilateral screening mammogram
will be due in Wednesday May, 2021.
2. Benign causes of breast pain, and possible remedies, were
discussed with the patient. The patient was instructed to return
sooner if a new palpable abnormality is identified in either breast.
Patient was encouraged to follow-up with referring physician if the
pain/tingling became focal and persistent as additional imaging with
chest x-ray or chest CT might then be needed to look at the deeper
structures of the chest.

I have discussed the findings and recommendations with the patient.
If applicable, a reminder letter will be sent to the patient
regarding the next appointment.

BI-RADS CATEGORY  1: Negative.

## 2022-04-04 ENCOUNTER — Other Ambulatory Visit: Payer: Self-pay | Admitting: Pediatrics

## 2022-04-04 DIAGNOSIS — Z6827 Body mass index (BMI) 27.0-27.9, adult: Secondary | ICD-10-CM | POA: Diagnosis not present

## 2022-04-04 DIAGNOSIS — R1030 Lower abdominal pain, unspecified: Secondary | ICD-10-CM | POA: Diagnosis not present

## 2022-04-04 DIAGNOSIS — I700 Atherosclerosis of aorta: Secondary | ICD-10-CM | POA: Diagnosis not present

## 2022-04-05 ENCOUNTER — Ambulatory Visit
Admission: RE | Admit: 2022-04-05 | Discharge: 2022-04-05 | Disposition: A | Discharge status: Home / Self Care | Payer: Medicare HMO | Source: Ambulatory Visit | Attending: Family Medicine | Admitting: Family Medicine

## 2022-04-05 DIAGNOSIS — K8020 Calculus of gallbladder without cholecystitis without obstruction: Secondary | ICD-10-CM | POA: Diagnosis not present

## 2022-04-05 DIAGNOSIS — K6389 Other specified diseases of intestine: Secondary | ICD-10-CM | POA: Diagnosis not present

## 2022-04-05 DIAGNOSIS — R160 Hepatomegaly, not elsewhere classified: Secondary | ICD-10-CM | POA: Diagnosis not present

## 2022-04-05 DIAGNOSIS — R1030 Lower abdominal pain, unspecified: Secondary | ICD-10-CM

## 2022-04-05 DIAGNOSIS — K5732 Diverticulitis of large intestine without perforation or abscess without bleeding: Secondary | ICD-10-CM | POA: Diagnosis not present

## 2022-04-05 MED ORDER — IOPAMIDOL (ISOVUE-300) INJECTION 61%
100.0000 mL | Freq: Once | INTRAVENOUS | Status: AC | PRN
  Administered 2022-04-05: 100 mL via INTRAVENOUS

## 2022-04-07 IMAGING — CT CT CARDIAC CORONARY ARTERY CALCIUM SCORE
3 series · 14 of 20 positions shown, 15 images · non-contrast
Comparison: None.
COMPARISON: None.

Addendum:
EXAM:
OVER-READ INTERPRETATION  CT CHEST

The following report is an over-read performed by radiologist Dr.
Ernest Erwa Fatic [REDACTED] on 09/11/2020. This
over-read does not include interpretation of cardiac or coronary
anatomy or pathology. The coronary calcium score interpretation by
the cardiologist is attached.
CLINICAL DATA: Risk stratification: 65 Year-old Caucasian Female
Coronary Calcium Score
TECHNIQUE: The patient was scanned on a Siemens Force scanner. Axial
non-contrast 3 mm slices were carried out through the heart. The
data set was analyzed on a dedicated work station and scored using
the Agatson method.

[Series 2: casc 3.0 bv41 2 bestdiast 76 % · axial · 0.39mm/px · z∈[-126,-51]mm · 4 of 43 slices shown, 5 images]
[im 9/43  vessel]
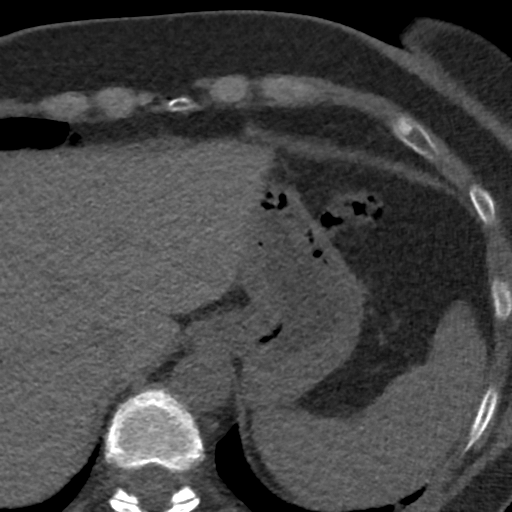
[im 9/43  lung]
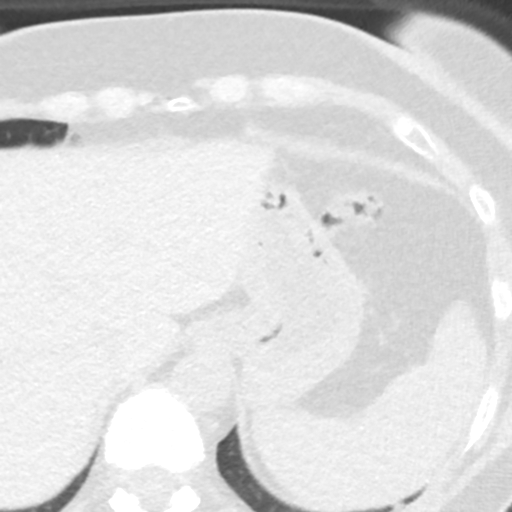
[im 17/43  vessel]
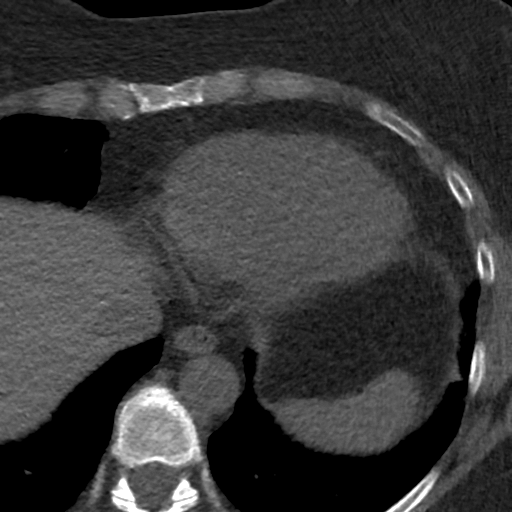
[im 26/43  vessel]
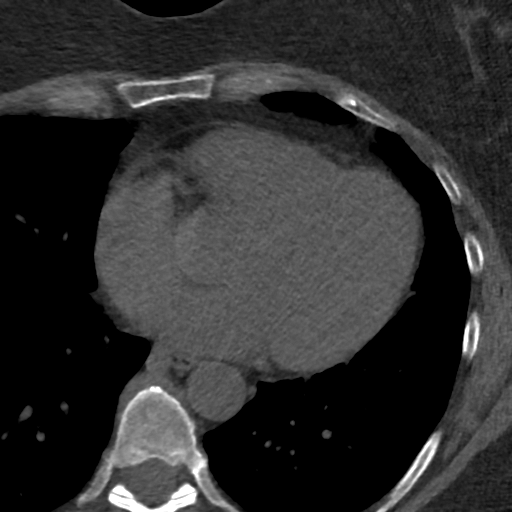
[im 34/43  vessel]
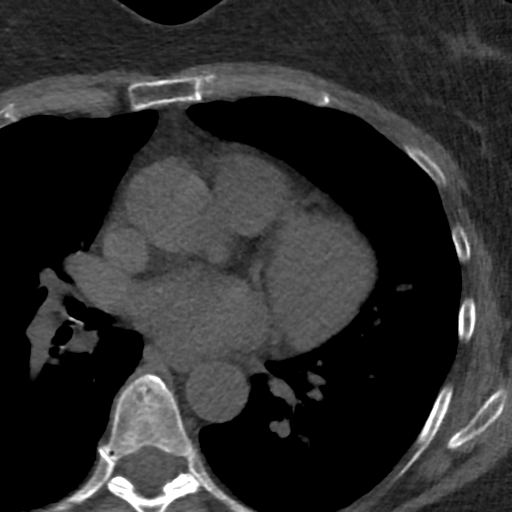

[Series 3: lung 77 % · axial · 0.68mm/px · z∈[-129,-45]mm · 5 of 43 slices shown]
[im 8/43  lung]
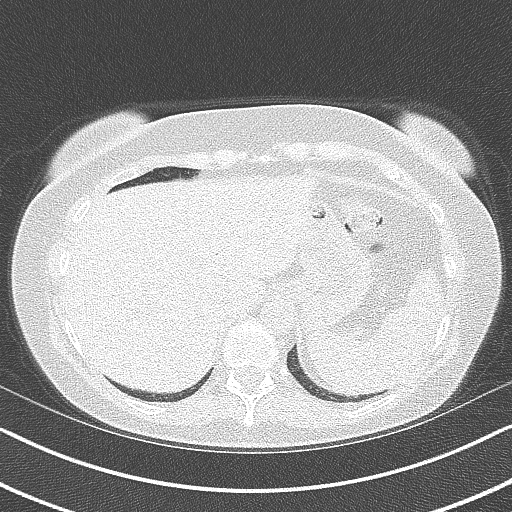
[im 15/43  lung]
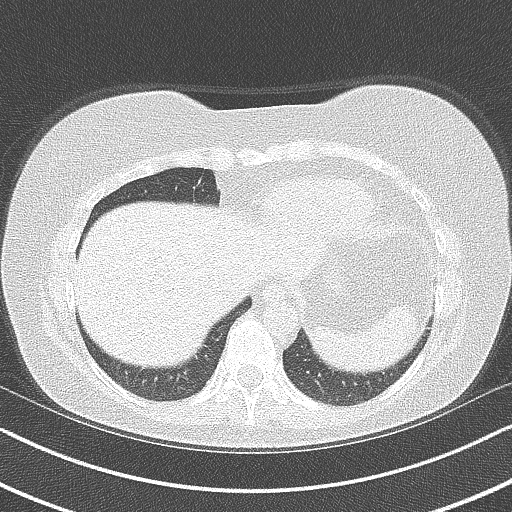
[im 22/43  lung]
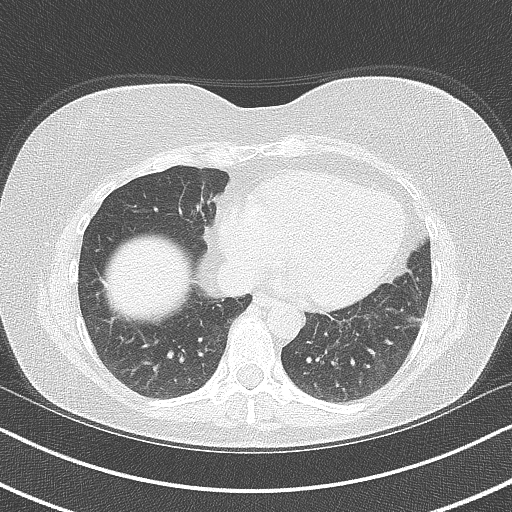
[im 29/43  lung]
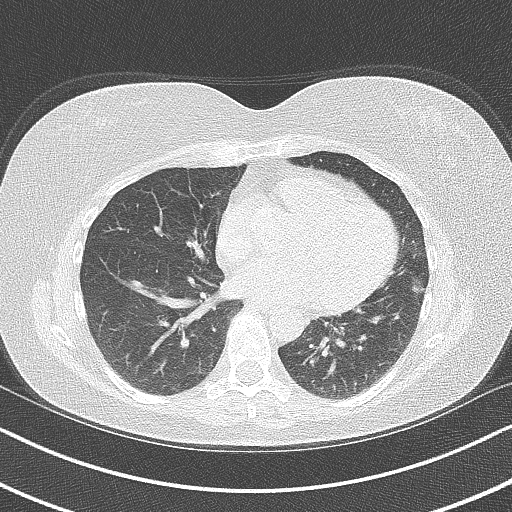
[im 36/43  lung]
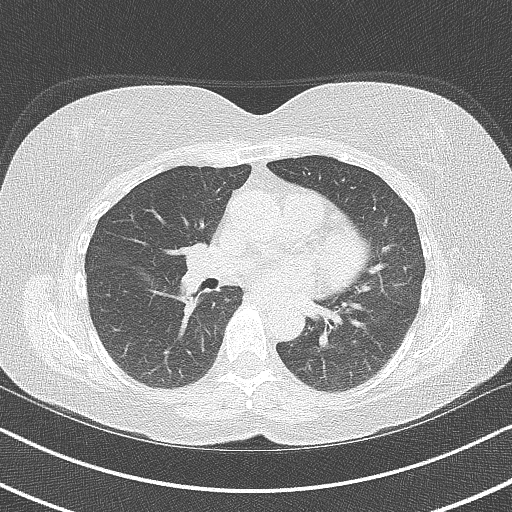

[Series 4: lung st 77 % · axial · 0.63mm/px · z∈[-129,-45]mm · 5 of 43 slices shown]
[im 8/43  lung]
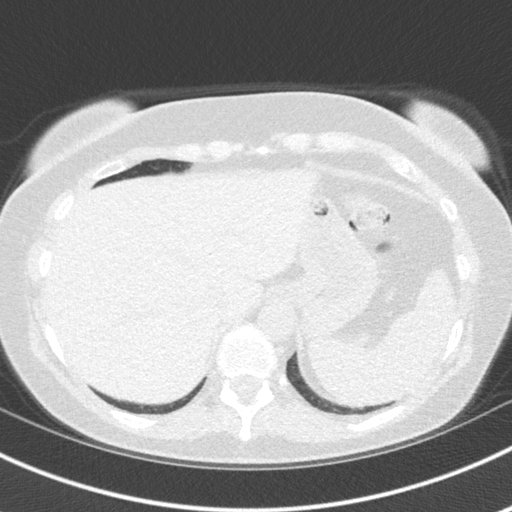
[im 15/43  lung]
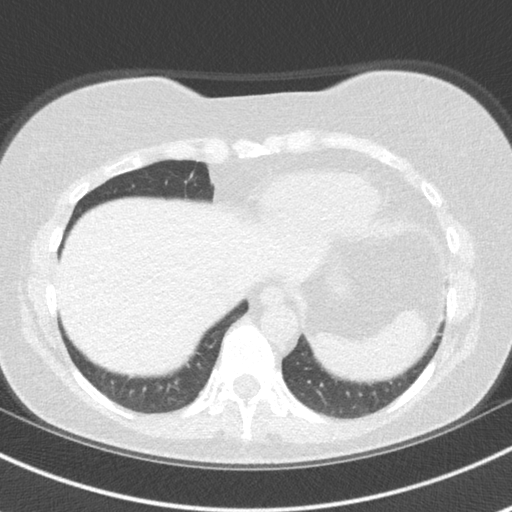
[im 22/43  lung]
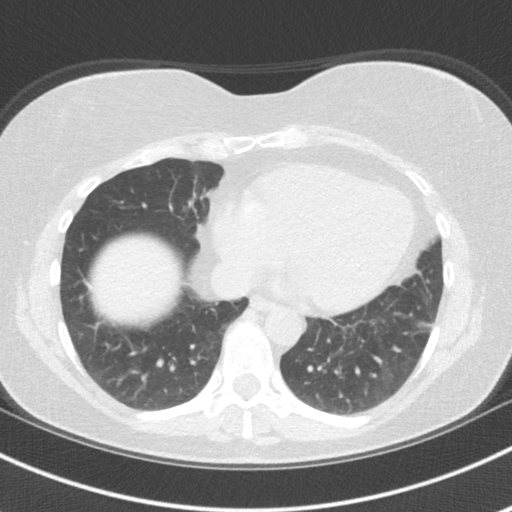
[im 29/43  lung]
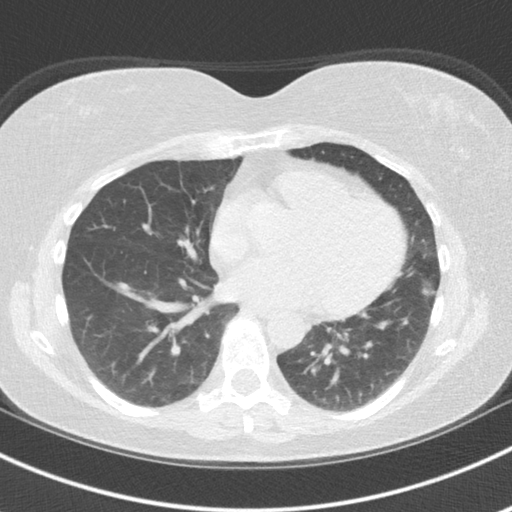
[im 36/43  lung]
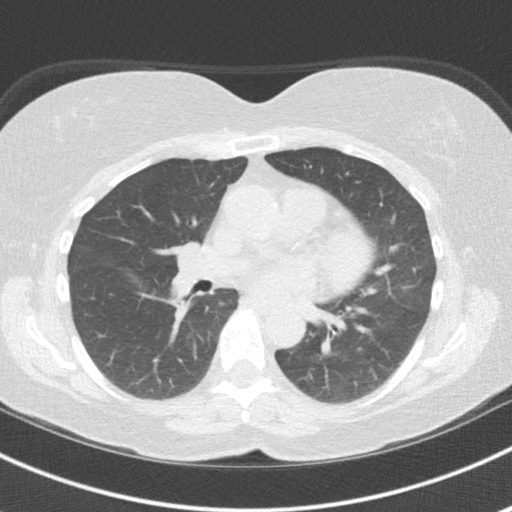

[14 of 20 positions shown; findings below may reference images not displayed]

FINDINGS: Within the visualized portions of the thorax there are no suspicious
appearing pulmonary nodules or masses, there is no acute
consolidative airspace disease, no pleural effusions, no
pneumothorax and no lymphadenopathy. Visualized portions of the
upper abdomen are unremarkable. There are no aggressive appearing
lytic or blastic lesions noted in the visualized portions of the
skeleton.
IMPRESSION: 1. No significant incidental noncardiac findings are noted.
FINDINGS: Non-cardiac: See separate report from [REDACTED].

Ascending Aorta: Normal caliber.

Pericardium: Normal.

Coronary arteries: Normal origins.

Coronary Calcium Score:

Left main: 0

Left anterior descending artery: 3

Left circumflex artery: 0

Right coronary artery: 0

Total: 3

Percentile: 57th for age, sex, and race matched control.
IMPRESSION: 1. Coronary calcium score of 3. This was 57th percentile for age,
gender, and race matched controls.

RECOMMENDATIONS:



If CAC = 0, it is reasonable to withhold statin therapy and reassess
in 5 to 10 years, as long as higher risk conditions are absent
(diabetes mellitus, family history of premature CHD in first degree
relatives (males <55 years; females <65 years), cigarette smoking,
LDL >=190 mg/dL or other independent risk factors).

If CAC is 1 to 99, it is reasonable to initiate statin therapy for
patients ?55 years of age.

If CAC is >=100 or >=75th percentile, it is reasonable to initiate
statin therapy at any age.

Cardiology referral should be considered for patients with CAC
scores =400 or >=75th percentile.

*5208 AHA/ACC/AACVPR/AAPA/ABC/BERTLIZ/NYA/MULALO/[place]ai/EDOGUN/PRITY/LEION
Guideline on the Management of Blood Cholesterol: A Report of the
American College of Cardiology/American Heart Association Task Force
on Clinical Practice Guidelines. J Am Coll Cardiol.
5421;73(24):9680-9507.

*** End of Addendum ***
EXAM:
OVER-READ INTERPRETATION  CT CHEST

The following report is an over-read performed by radiologist Dr.
Ernest Erwa Fatic [REDACTED] on 09/11/2020. This
over-read does not include interpretation of cardiac or coronary
anatomy or pathology. The coronary calcium score interpretation by
the cardiologist is attached.
FINDINGS: Within the visualized portions of the thorax there are no suspicious
appearing pulmonary nodules or masses, there is no acute
consolidative airspace disease, no pleural effusions, no
pneumothorax and no lymphadenopathy. Visualized portions of the
upper abdomen are unremarkable. There are no aggressive appearing
lytic or blastic lesions noted in the visualized portions of the
skeleton.
IMPRESSION: 1. No significant incidental noncardiac findings are noted.

## 2022-05-05 DIAGNOSIS — K5792 Diverticulitis of intestine, part unspecified, without perforation or abscess without bleeding: Secondary | ICD-10-CM | POA: Diagnosis not present

## 2022-05-05 DIAGNOSIS — R9389 Abnormal findings on diagnostic imaging of other specified body structures: Secondary | ICD-10-CM | POA: Diagnosis not present

## 2022-05-10 DIAGNOSIS — M25551 Pain in right hip: Secondary | ICD-10-CM | POA: Diagnosis not present

## 2022-05-13 DIAGNOSIS — M25551 Pain in right hip: Secondary | ICD-10-CM | POA: Diagnosis not present

## 2022-05-17 DIAGNOSIS — M25551 Pain in right hip: Secondary | ICD-10-CM | POA: Diagnosis not present

## 2022-06-02 DIAGNOSIS — H2513 Age-related nuclear cataract, bilateral: Secondary | ICD-10-CM | POA: Diagnosis not present

## 2022-06-10 DIAGNOSIS — E7800 Pure hypercholesterolemia, unspecified: Secondary | ICD-10-CM | POA: Diagnosis not present

## 2022-06-10 DIAGNOSIS — R7989 Other specified abnormal findings of blood chemistry: Secondary | ICD-10-CM | POA: Diagnosis not present

## 2022-06-10 DIAGNOSIS — D473 Essential (hemorrhagic) thrombocythemia: Secondary | ICD-10-CM | POA: Diagnosis not present

## 2022-06-10 DIAGNOSIS — E559 Vitamin D deficiency, unspecified: Secondary | ICD-10-CM | POA: Diagnosis not present

## 2022-06-10 DIAGNOSIS — K7689 Other specified diseases of liver: Secondary | ICD-10-CM | POA: Diagnosis not present

## 2022-06-17 DIAGNOSIS — Z0000 Encounter for general adult medical examination without abnormal findings: Secondary | ICD-10-CM | POA: Diagnosis not present

## 2022-06-17 DIAGNOSIS — Z79899 Other long term (current) drug therapy: Secondary | ICD-10-CM | POA: Diagnosis not present

## 2022-06-17 DIAGNOSIS — Z23 Encounter for immunization: Secondary | ICD-10-CM | POA: Diagnosis not present

## 2022-06-17 DIAGNOSIS — Z6827 Body mass index (BMI) 27.0-27.9, adult: Secondary | ICD-10-CM | POA: Diagnosis not present

## 2022-06-17 DIAGNOSIS — E559 Vitamin D deficiency, unspecified: Secondary | ICD-10-CM | POA: Diagnosis not present

## 2022-06-17 DIAGNOSIS — E7800 Pure hypercholesterolemia, unspecified: Secondary | ICD-10-CM | POA: Diagnosis not present

## 2022-06-17 DIAGNOSIS — I700 Atherosclerosis of aorta: Secondary | ICD-10-CM | POA: Diagnosis not present

## 2022-06-17 DIAGNOSIS — D75839 Thrombocytosis, unspecified: Secondary | ICD-10-CM | POA: Diagnosis not present

## 2022-06-20 DIAGNOSIS — M1611 Unilateral primary osteoarthritis, right hip: Secondary | ICD-10-CM | POA: Diagnosis not present

## 2022-06-28 DIAGNOSIS — Z83719 Family history of colon polyps, unspecified: Secondary | ICD-10-CM | POA: Diagnosis not present

## 2022-06-28 DIAGNOSIS — K649 Unspecified hemorrhoids: Secondary | ICD-10-CM | POA: Diagnosis not present

## 2022-06-28 DIAGNOSIS — K5730 Diverticulosis of large intestine without perforation or abscess without bleeding: Secondary | ICD-10-CM | POA: Diagnosis not present

## 2022-06-28 DIAGNOSIS — R933 Abnormal findings on diagnostic imaging of other parts of digestive tract: Secondary | ICD-10-CM | POA: Diagnosis not present

## 2022-06-29 ENCOUNTER — Other Ambulatory Visit: Payer: Self-pay

## 2022-06-29 ENCOUNTER — Emergency Department (HOSPITAL_BASED_OUTPATIENT_CLINIC_OR_DEPARTMENT_OTHER)
Admission: EM | Admit: 2022-06-29 | Discharge: 2022-06-30 | Disposition: A | Discharge status: Home / Self Care | Payer: Medicare HMO | Attending: Emergency Medicine | Admitting: Emergency Medicine

## 2022-06-29 ENCOUNTER — Emergency Department (HOSPITAL_BASED_OUTPATIENT_CLINIC_OR_DEPARTMENT_OTHER): Payer: Medicare HMO

## 2022-06-29 ENCOUNTER — Emergency Department (HOSPITAL_BASED_OUTPATIENT_CLINIC_OR_DEPARTMENT_OTHER): Payer: Medicare HMO | Admitting: Radiology

## 2022-06-29 DIAGNOSIS — R7989 Other specified abnormal findings of blood chemistry: Secondary | ICD-10-CM | POA: Diagnosis not present

## 2022-06-29 DIAGNOSIS — K5792 Diverticulitis of intestine, part unspecified, without perforation or abscess without bleeding: Secondary | ICD-10-CM

## 2022-06-29 DIAGNOSIS — Z20822 Contact with and (suspected) exposure to covid-19: Secondary | ICD-10-CM | POA: Diagnosis not present

## 2022-06-29 DIAGNOSIS — R000 Tachycardia, unspecified: Secondary | ICD-10-CM | POA: Diagnosis not present

## 2022-06-29 DIAGNOSIS — Z859 Personal history of malignant neoplasm, unspecified: Secondary | ICD-10-CM | POA: Diagnosis not present

## 2022-06-29 DIAGNOSIS — K5732 Diverticulitis of large intestine without perforation or abscess without bleeding: Secondary | ICD-10-CM | POA: Insufficient documentation

## 2022-06-29 DIAGNOSIS — R509 Fever, unspecified: Secondary | ICD-10-CM | POA: Diagnosis not present

## 2022-06-29 DIAGNOSIS — K8020 Calculus of gallbladder without cholecystitis without obstruction: Secondary | ICD-10-CM | POA: Diagnosis not present

## 2022-06-29 LAB — CBC WITH DIFFERENTIAL/PLATELET
Abs Immature Granulocytes: 0.03 10*3/uL (ref 0.00–0.07)
Basophils Absolute: 0 10*3/uL (ref 0.0–0.1)
Basophils Relative: 1 %
Eosinophils Absolute: 0.1 10*3/uL (ref 0.0–0.5)
Eosinophils Relative: 1 %
HCT: 36.2 % (ref 36.0–46.0)
Hemoglobin: 12.1 g/dL (ref 12.0–15.0)
Immature Granulocytes: 0 %
Lymphocytes Relative: 7 %
Lymphs Abs: 0.5 10*3/uL — ABNORMAL LOW (ref 0.7–4.0)
MCH: 28.3 pg (ref 26.0–34.0)
MCHC: 33.4 g/dL (ref 30.0–36.0)
MCV: 84.8 fL (ref 80.0–100.0)
Monocytes Absolute: 0.1 10*3/uL (ref 0.1–1.0)
Monocytes Relative: 1 %
Neutro Abs: 6.3 10*3/uL (ref 1.7–7.7)
Neutrophils Relative %: 90 %
Platelets: 364 10*3/uL (ref 150–400)
RBC: 4.27 MIL/uL (ref 3.87–5.11)
RDW: 13.1 % (ref 11.5–15.5)
WBC: 7 10*3/uL (ref 4.0–10.5)
nRBC: 0 % (ref 0.0–0.2)

## 2022-06-29 LAB — COMPREHENSIVE METABOLIC PANEL
ALT: 74 U/L — ABNORMAL HIGH (ref 0–44)
AST: 58 U/L — ABNORMAL HIGH (ref 15–41)
Albumin: 4.4 g/dL (ref 3.5–5.0)
Alkaline Phosphatase: 91 U/L (ref 38–126)
Anion gap: 10 (ref 5–15)
BUN: 13 mg/dL (ref 8–23)
CO2: 23 mmol/L (ref 22–32)
Calcium: 9.7 mg/dL (ref 8.9–10.3)
Chloride: 106 mmol/L (ref 98–111)
Creatinine, Ser: 0.68 mg/dL (ref 0.44–1.00)
GFR, Estimated: >60 mL/min (ref >60)
Glucose, Bld: 141 mg/dL — ABNORMAL HIGH (ref 70–99)
Potassium: 3.6 mmol/L (ref 3.5–5.1)
Sodium: 139 mmol/L (ref 135–145)
Total Bilirubin: 0.7 mg/dL (ref 0.3–1.2)
Total Protein: 7.2 g/dL (ref 6.5–8.1)

## 2022-06-29 LAB — RESP PANEL BY RT-PCR (RSV, FLU A&B, COVID)  RVPGX2
Influenza A by PCR: NEGATIVE
Influenza B by PCR: NEGATIVE
Resp Syncytial Virus by PCR: NEGATIVE
SARS Coronavirus 2 by RT PCR: NEGATIVE

## 2022-06-29 LAB — LACTIC ACID, PLASMA: Lactic Acid, Venous: 1.6 mmol/L (ref 0.5–1.9)

## 2022-06-29 MED ORDER — IOHEXOL 300 MG/ML  SOLN
100.0000 mL | Freq: Once | INTRAMUSCULAR | Status: AC | PRN
  Administered 2022-06-29: 75 mL via INTRAVENOUS

## 2022-06-29 NOTE — ED Triage Notes (Addendum)
Pt arrives via POV d/t concerns for fevers, aching, and chills that started today. No cough. No CP/SOB. Pt endorses having a routine colonoscopy yesterday at [facility]. No complications from procedure. No abdominal pain. Took ibuprofen PTA. Recommended ED visit by gastroenterologist.

## 2022-06-29 NOTE — ED Provider Notes (Signed)
[facility] Provider Note   CSN: [id number] Arrival date & time: 06/29/22  2122     History {Add pertinent medical, surgical, social history, OB history to HPI:1} Chief Complaint  Patient presents with   Fever    Shannon Vasquez is a 67 y.o. female.  HPI     This is a 67 year old female who presents with fever.  Patient reports that she had a colonoscopy yesterday.  She states that it was a routine colonoscopy and did not have any polyps removed.  She states today she has had a temperature up to 102.1.  Her symptoms started out with significant chills.  She took an ibuprofen and then noted her temperature.  She called the gastroenterologist on-call and was referred to the ER.  Denies significant other symptoms including dysuria, cough, abdominal pain.  She states she has had some mild "gas-like" symptoms but no overt abdominal pain.  No nausea or vomiting.  She has not had a bowel movement since the colonoscopy.  Home Medications Prior to Admission medications   Medication Sig Start Date End Date Taking? Authorizing Provider  Cholecalciferol (VITAMIN D3) 25 MCG (1000 UT) CAPS Take 1 capsule by mouth daily.    [provider]  ibuprofen (ADVIL) 200 MG tablet Take 1 tablet by mouth 3 times/day as needed-between meals & bedtime.    [provider]  Multiple Vitamin (ONE DAILY) tablet Take 1 tablet by mouth daily.    [provider]  NEXLIZET 180-10 MG TABS Take 1 tablet by mouth daily. 08/03/20   [provider]      Allergies    Penicillins    Review of Systems   Review of Systems  Constitutional:  Positive for chills and fever.  Gastrointestinal:  Negative for abdominal pain.  All other systems reviewed and are negative.   Physical Exam Updated Vital Signs BP 123/74   Pulse (!) 106   Temp 99.8 F (37.7 C) (Oral)   Resp 16   SpO2 95%  Physical Exam Vitals and nursing note reviewed.   Constitutional:      Appearance: She is well-developed. She is not ill-appearing.  HENT:     Head: Normocephalic and atraumatic.  Eyes:     Pupils: Pupils are equal, round, and reactive to light.  Cardiovascular:     Rate and Rhythm: Normal rate and regular rhythm.     Heart sounds: Normal heart sounds.  Pulmonary:     Effort: Pulmonary effort is normal. No respiratory distress.     Breath sounds: No wheezing.  Abdominal:     General: Bowel sounds are normal.     Palpations: Abdomen is soft.     Tenderness: There is abdominal tenderness. There is no guarding or rebound.     Comments: Mild periumbilical tenderness to palpation, no rebound or guarding  Musculoskeletal:     Cervical back: Neck supple.  Skin:    General: Skin is warm and dry.  Neurological:     Mental Status: She is alert and oriented to person, place, and time.  Psychiatric:        Mood and Affect: Mood normal.     ED Results / Procedures / Treatments   Labs (all labs ordered are listed, but only abnormal results are displayed) Labs Reviewed  COMPREHENSIVE METABOLIC PANEL - Abnormal; Notable for the following components:      Result Value   Glucose, Bld 141 (*)    AST 58 (*)  ALT 74 (*)    All other components within normal limits  CBC WITH DIFFERENTIAL/PLATELET - Abnormal; Notable for the following components:   Lymphs Abs 0.5 (*)    All other components within normal limits  RESP PANEL BY RT-PCR (RSV, FLU A&B, COVID)  RVPGX2  CULTURE, BLOOD (ROUTINE X 2)  CULTURE, BLOOD (ROUTINE X 2)  LACTIC ACID, PLASMA  LACTIC ACID, PLASMA    EKG None  Radiology DG Chest 2 View  Result Date: 06/29/2022 CLINICAL DATA:  Fever EXAM: CHEST - 2 VIEW COMPARISON:  None Available. FINDINGS: The heart size and mediastinal contours are within normal limits. Both lungs are clear. The visualized skeletal structures are unremarkable. IMPRESSION: No active cardiopulmonary disease. Electronically Signed   By: Fidela Salisbury  M.D.   On: 06/29/2022 22:27    Procedures Procedures  {Document cardiac monitor, telemetry assessment procedure when appropriate:1}  Medications Ordered in ED Medications  iohexol (OMNIPAQUE) 300 MG/ML solution 100 mL (75 mLs Intravenous Contrast Given 06/29/22 2315)    ED Course/ Medical Decision Making/ A&P   {   Click here for ABCD2, HEART and other calculatorsREFRESH Note before signing :1}                          Medical Decision Making Amount and/or Complexity of Data Reviewed Labs: ordered. Radiology: ordered.  Risk Prescription drug management.   ***  {Document critical care time when appropriate:1} {Document review of labs and clinical decision tools ie heart score, Chads2Vasc2 etc:1}  {Document your independent review of radiology images, and any outside records:1} {Document your discussion with family members, caretakers, and with consultants:1} {Document social determinants of health affecting pt's care:1} {Document your decision making why or why not admission, treatments were needed:1} Final Clinical Impression(s) / ED Diagnoses Final diagnoses:  None    Rx / DC Orders ED Discharge Orders     None

## 2022-06-29 NOTE — ED Notes (Signed)
Patient ambulatory with steady gait to restroom

## 2022-06-29 NOTE — ED Notes (Signed)
Patient transported to CT 

## 2022-06-30 LAB — URINALYSIS, ROUTINE W REFLEX MICROSCOPIC
Bilirubin Urine: NEGATIVE
Glucose, UA: NEGATIVE mg/dL
Hgb urine dipstick: NEGATIVE
Ketones, ur: NEGATIVE mg/dL
Leukocytes,Ua: NEGATIVE
Nitrite: NEGATIVE
Protein, ur: NEGATIVE mg/dL
Specific Gravity, Urine: 1.009 (ref 1.005–1.030)
pH: 5.5 (ref 5.0–8.0)

## 2022-06-30 MED ORDER — CIPROFLOXACIN HCL 500 MG PO TABS: 500.0000 mg | ORAL_TABLET | Freq: Two times a day (BID) | ORAL | 0 refills | Status: DC

## 2022-06-30 MED ORDER — METRONIDAZOLE 500 MG PO TABS: 500.0000 mg | ORAL_TABLET | Freq: Two times a day (BID) | ORAL | 0 refills | Status: DC

## 2022-06-30 MED ORDER — METRONIDAZOLE 500 MG/100ML IV SOLN
500.0000 mg | Freq: Once | INTRAVENOUS | Status: AC
  Administered 2022-06-30: 500 mg via INTRAVENOUS
  Filled 2022-06-30: qty 100

## 2022-06-30 MED ORDER — CIPROFLOXACIN HCL 500 MG PO TABS
500.0000 mg | ORAL_TABLET | Freq: Once | ORAL | Status: AC
  Administered 2022-06-30: 500 mg via ORAL
  Filled 2022-06-30: qty 1

## 2022-06-30 NOTE — ED Notes (Signed)
Reviewed AVS with patient, patient expressed understanding of directions, denies further questions at this time. 

## 2022-06-30 NOTE — ED Notes (Signed)
Provided pt with water and Diet Ginger Ale for PO challenge.

## 2022-06-30 NOTE — Discharge Instructions (Signed)
You were seen today for fever post colonoscopy.  Your CT scan is notable for diverticulitis.  Take antibiotics as instructed.  Follow-up closely with Dr. Watt Climes.  If you have worsening abdominal pain, nausea, vomiting, any new or worsening symptoms, you should be reevaluated.

## 2022-07-01 LAB — CULTURE, BLOOD (ROUTINE X 2): Culture: NO GROWTH

## 2022-07-02 LAB — CULTURE, BLOOD (ROUTINE X 2): Special Requests: ADEQUATE

## 2022-07-03 LAB — CULTURE, BLOOD (ROUTINE X 2)
Culture: NO GROWTH
Special Requests: ADEQUATE

## 2022-07-04 LAB — CULTURE, BLOOD (ROUTINE X 2)

## 2022-07-14 DIAGNOSIS — K7689 Other specified diseases of liver: Secondary | ICD-10-CM | POA: Diagnosis not present

## 2022-08-01 DIAGNOSIS — Z6827 Body mass index (BMI) 27.0-27.9, adult: Secondary | ICD-10-CM | POA: Diagnosis not present

## 2022-08-01 DIAGNOSIS — R1011 Right upper quadrant pain: Secondary | ICD-10-CM | POA: Diagnosis not present

## 2022-08-12 DIAGNOSIS — R1011 Right upper quadrant pain: Secondary | ICD-10-CM | POA: Diagnosis not present

## 2022-08-12 DIAGNOSIS — K8020 Calculus of gallbladder without cholecystitis without obstruction: Secondary | ICD-10-CM | POA: Diagnosis not present

## 2022-08-18 DIAGNOSIS — M1611 Unilateral primary osteoarthritis, right hip: Secondary | ICD-10-CM | POA: Diagnosis not present

## 2022-09-01 DIAGNOSIS — Z471 Aftercare following joint replacement surgery: Secondary | ICD-10-CM | POA: Diagnosis not present

## 2022-09-01 DIAGNOSIS — Z96641 Presence of right artificial hip joint: Secondary | ICD-10-CM | POA: Diagnosis not present

## 2022-10-12 DIAGNOSIS — M1711 Unilateral primary osteoarthritis, right knee: Secondary | ICD-10-CM | POA: Diagnosis not present

## 2022-10-12 DIAGNOSIS — Z471 Aftercare following joint replacement surgery: Secondary | ICD-10-CM | POA: Diagnosis not present

## 2022-10-12 DIAGNOSIS — Z96641 Presence of right artificial hip joint: Secondary | ICD-10-CM | POA: Diagnosis not present

## 2022-10-12 DIAGNOSIS — M25561 Pain in right knee: Secondary | ICD-10-CM | POA: Diagnosis not present

## 2022-11-08 DIAGNOSIS — Z01419 Encounter for gynecological examination (general) (routine) without abnormal findings: Secondary | ICD-10-CM | POA: Diagnosis not present

## 2022-11-08 DIAGNOSIS — Z6826 Body mass index (BMI) 26.0-26.9, adult: Secondary | ICD-10-CM | POA: Diagnosis not present

## 2022-11-23 DIAGNOSIS — M79672 Pain in left foot: Secondary | ICD-10-CM | POA: Diagnosis not present

## 2022-11-23 DIAGNOSIS — R2242 Localized swelling, mass and lump, left lower limb: Secondary | ICD-10-CM | POA: Diagnosis not present

## 2022-12-01 DIAGNOSIS — Z0100 Encounter for examination of eyes and vision without abnormal findings: Secondary | ICD-10-CM | POA: Diagnosis not present

## 2023-01-04 DIAGNOSIS — N958 Other specified menopausal and perimenopausal disorders: Secondary | ICD-10-CM | POA: Diagnosis not present

## 2023-01-04 DIAGNOSIS — M8588 Other specified disorders of bone density and structure, other site: Secondary | ICD-10-CM | POA: Diagnosis not present

## 2023-01-04 DIAGNOSIS — Z1231 Encounter for screening mammogram for malignant neoplasm of breast: Secondary | ICD-10-CM | POA: Diagnosis not present

## 2023-01-15 ENCOUNTER — Telehealth: Payer: Medicare HMO | Admitting: Physician Assistant

## 2023-01-15 DIAGNOSIS — U071 COVID-19: Secondary | ICD-10-CM

## 2023-01-15 MED ORDER — NIRMATRELVIR/RITONAVIR (PAXLOVID)TABLET
3.0000 | ORAL_TABLET | Freq: Two times a day (BID) | ORAL | 0 refills | Status: AC
Start: 2023-01-15 — End: 2023-01-20

## 2023-01-15 MED ORDER — BENZONATATE 100 MG PO CAPS
100.0000 mg | ORAL_CAPSULE | Freq: Three times a day (TID) | ORAL | 0 refills | Status: DC | PRN
Start: 2023-01-15 — End: 2023-04-18

## 2023-01-15 NOTE — Patient Instructions (Signed)
Lyndle Herrlich, thank you for joining [name], PA-C for today's virtual visit.  While this provider is not your primary care provider (PCP), if your PCP is located in our provider database this encounter information will be shared with them immediately following your visit.   A [facility] MyChart account gives you access to today's visit and all your visits, tests, and labs performed at [facility] " click here if you don't have a  MyChart account or go to [email or web address]  Consent: (Patient) Shannon Vasquez provided verbal consent for this virtual visit at the beginning of the encounter.  Current Medications:  Current Outpatient Medications:    benzonatate (TESSALON) 100 MG capsule, Take 1 capsule (100 mg total) by mouth 3 (three) times daily as needed., Disp: 20 capsule, Rfl: 0   nirmatrelvir/ritonavir (PAXLOVID) 20 x 150 MG & 10 x 100MG  TABS, Take 3 tablets by mouth 2 (two) times daily for 5 days. (Take nirmatrelvir 150 mg two tablets twice daily for 5 days and ritonavir 100 mg one tablet twice daily for 5 days) Patient GFR is >60, Disp: 30 tablet, Rfl: 0   Cholecalciferol (VITAMIN D3) 25 MCG (1000 UT) CAPS, Take 1 capsule by mouth daily., Disp: , Rfl:    ciprofloxacin (CIPRO) 500 MG tablet, Take 1 tablet (500 mg total) by mouth every 12 (twelve) hours., Disp: 14 tablet, Rfl: 0   ibuprofen (ADVIL) 200 MG tablet, Take 1 tablet by mouth 3 times/day as needed-between meals & bedtime., Disp: , Rfl:    metroNIDAZOLE (FLAGYL) 500 MG tablet, Take 1 tablet (500 mg total) by mouth 2 (two) times daily., Disp: 14 tablet, Rfl: 0   Multiple Vitamin (ONE DAILY) tablet, Take 1 tablet by mouth daily., Disp: , Rfl:    NEXLIZET 180-10 MG TABS, Take 1 tablet by mouth daily., Disp: , Rfl:    Medications ordered in this encounter:  Meds ordered this encounter  Medications   nirmatrelvir/ritonavir (PAXLOVID) 20 x 150 MG & 10 x 100MG  TABS    Sig: Take 3 tablets  by mouth 2 (two) times daily for 5 days. (Take nirmatrelvir 150 mg two tablets twice daily for 5 days and ritonavir 100 mg one tablet twice daily for 5 days) Patient GFR is >60    Dispense:  30 tablet    Refill:  0    Order Specific Question:   Supervising Provider    Answer:   Merrilee Jansky [[id number]]   benzonatate (TESSALON) 100 MG capsule    Sig: Take 1 capsule (100 mg total) by mouth 3 (three) times daily as needed.    Dispense:  20 capsule    Refill:  0    Order Specific Question:   Supervising Provider    Answer:   Merrilee Jansky [id number]     *If you need refills on other medications prior to your next appointment, please contact your pharmacy*  Follow-Up: Call back or seek an in-person evaluation if the symptoms worsen or if the condition fails to improve as anticipated.  [facility] [phone]  Other Instructions Take Paxlovid as prescribed.  Can take tessalon as needed for cough.  Recommend Mucinex and Flonase for congestion.  Can take Ibuprofen or Tylenol as needed for fever, headache, body aches.  Drink plenty of fluids, rest.    If you have been instructed to have an in-person evaluation today at a local Urgent Care facility, please use the link below. It will take you  to a list of all of our available [facility], including address, phone number and hours of operation. Please do not delay care.  [place] [facility]  If you or a family member do not have a primary care provider, use the link below to schedule a visit and establish care. When you choose a [facility] primary care physician or advanced practice provider, you gain a long-term partner in health. Find a Primary Care Provider  Learn more about [facility]'s in-office and virtual care options: [facility] - Get Care Now

## 2023-01-15 NOTE — Progress Notes (Signed)
Virtual Visit Consent   [name], you are scheduled for a virtual visit with a [facility] provider today. Just as with appointments in the office, your consent must be obtained to participate. Your consent will be active for this visit and any virtual visit you may have with one of our providers in the next 365 days. If you have a MyChart account, a copy of this consent can be sent to you electronically.  As this is a virtual visit, video technology does not allow for your provider to perform a traditional examination. This may limit your provider's ability to fully assess your condition. If your provider identifies any concerns that need to be evaluated in person or the need to arrange testing (such as labs, EKG, etc.), we will make arrangements to do so. Although advances in technology are sophisticated, we cannot ensure that it will always work on either your end or our end. If the connection with a video visit is poor, the visit may have to be switched to a telephone visit. With either a video or telephone visit, we are not always able to ensure that we have a secure connection.  By engaging in this virtual visit, you consent to the provision of healthcare and authorize for your insurance to be billed (if applicable) for the services provided during this visit. Depending on your insurance coverage, you may receive a charge related to this service.  I need to obtain your verbal consent now. Are you willing to proceed with your visit today? Shannon Vasquez has provided verbal consent on 01/15/2023 for a virtual visit (video or telephone). Shannon Fantasia Ward, PA-C  Date: 01/15/2023 5:05 PM  Virtual Visit via Video Note   I, Shannon Vasquez, connected with  Shannon Vasquez  ([id number], 1955-06-12) on 01/15/23 at  5:00 PM EDT by a video-enabled telemedicine application and verified that I am speaking with the correct person using two identifiers.  Location: Patient: Virtual Visit  Location Patient: Home Provider: Virtual Visit Location Provider: Home Office   I discussed the limitations of evaluation and management by telemedicine and the availability of in person appointments. The patient expressed understanding and agreed to proceed.    History of Present Illness: Shannon Vasquez is a 67 y.o. who identifies as a female who was assigned female at birth, and is being seen today for headache and congestion that started yesterday.  Taking ibuprofen with some relief.  Reports mild cough.  Denies wheezing or shortness of breath.  Denies h/o lung disease.  Reports her husband is sick with similar sx, prescribed Paxlovid.  HPI: HPI  Problems: There are no problems to display for this patient.   Allergies:  Allergies  Allergen Reactions   Penicillins    Medications:  Current Outpatient Medications:    benzonatate (TESSALON) 100 MG capsule, Take 1 capsule (100 mg total) by mouth 3 (three) times daily as needed., Disp: 20 capsule, Rfl: 0   nirmatrelvir/ritonavir (PAXLOVID) 20 x 150 MG & 10 x 100MG  TABS, Take 3 tablets by mouth 2 (two) times daily for 5 days. (Take nirmatrelvir 150 mg two tablets twice daily for 5 days and ritonavir 100 mg one tablet twice daily for 5 days) Patient GFR is >60, Disp: 30 tablet, Rfl: 0   Cholecalciferol (VITAMIN D3) 25 MCG (1000 UT) CAPS, Take 1 capsule by mouth daily., Disp: , Rfl:    ciprofloxacin (CIPRO) 500 MG tablet, Take 1 tablet (500 mg total) by mouth every 12 (  twelve) hours., Disp: 14 tablet, Rfl: 0   ibuprofen (ADVIL) 200 MG tablet, Take 1 tablet by mouth 3 times/day as needed-between meals & bedtime., Disp: , Rfl:    metroNIDAZOLE (FLAGYL) 500 MG tablet, Take 1 tablet (500 mg total) by mouth 2 (two) times daily., Disp: 14 tablet, Rfl: 0   Multiple Vitamin (ONE DAILY) tablet, Take 1 tablet by mouth daily., Disp: , Rfl:    NEXLIZET 180-10 MG TABS, Take 1 tablet by mouth daily., Disp: , Rfl:   Observations/Objective: Patient is  well-developed, well-nourished in no acute distress.  Resting comfortably at home.  Head is normocephalic, atraumatic.  No labored breathing.  Speech is clear and coherent with logical content.  Patient is alert and oriented at baseline.    Assessment and Plan: 1. COVID - nirmatrelvir/ritonavir (PAXLOVID) 20 x 150 MG & 10 x 100MG  TABS; Take 3 tablets by mouth 2 (two) times daily for 5 days. (Take nirmatrelvir 150 mg two tablets twice daily for 5 days and ritonavir 100 mg one tablet twice daily for 5 days) Patient GFR is >60  Dispense: 30 tablet; Refill: 0 - benzonatate (TESSALON) 100 MG capsule; Take 1 capsule (100 mg total) by mouth 3 (three) times daily as needed.  Dispense: 20 capsule; Refill: 0  Supportive care discussed.   Follow Up Instructions: I discussed the assessment and treatment plan with the patient. The patient was provided an opportunity to ask questions and all were answered. The patient agreed with the plan and demonstrated an understanding of the instructions.  A copy of instructions were sent to the patient via MyChart unless otherwise noted below.     The patient was advised to call back or seek an in-person evaluation if the symptoms worsen or if the condition fails to improve as anticipated.    Shannon Fantasia Ward, PA-C

## 2023-01-24 ENCOUNTER — Encounter (HOSPITAL_BASED_OUTPATIENT_CLINIC_OR_DEPARTMENT_OTHER): Payer: Self-pay | Admitting: Emergency Medicine

## 2023-01-24 ENCOUNTER — Other Ambulatory Visit: Payer: Self-pay

## 2023-01-24 ENCOUNTER — Emergency Department (HOSPITAL_BASED_OUTPATIENT_CLINIC_OR_DEPARTMENT_OTHER): Payer: Medicare HMO

## 2023-01-24 ENCOUNTER — Emergency Department (HOSPITAL_BASED_OUTPATIENT_CLINIC_OR_DEPARTMENT_OTHER)
Admission: EM | Admit: 2023-01-24 | Discharge: 2023-01-24 | Disposition: A | Discharge status: Home / Self Care | Payer: Medicare HMO | Attending: Emergency Medicine | Admitting: Emergency Medicine

## 2023-01-24 ENCOUNTER — Other Ambulatory Visit (HOSPITAL_BASED_OUTPATIENT_CLINIC_OR_DEPARTMENT_OTHER): Payer: Self-pay

## 2023-01-24 DIAGNOSIS — R9431 Abnormal electrocardiogram [ECG] [EKG]: Secondary | ICD-10-CM | POA: Diagnosis not present

## 2023-01-24 DIAGNOSIS — G510 Bell's palsy: Secondary | ICD-10-CM | POA: Diagnosis not present

## 2023-01-24 DIAGNOSIS — R29810 Facial weakness: Secondary | ICD-10-CM | POA: Diagnosis present

## 2023-01-24 DIAGNOSIS — I6523 Occlusion and stenosis of bilateral carotid arteries: Secondary | ICD-10-CM | POA: Diagnosis not present

## 2023-01-24 LAB — CBC WITH DIFFERENTIAL/PLATELET
Abs Immature Granulocytes: 0.01 10*3/uL (ref 0.00–0.07)
Basophils Absolute: 0.1 10*3/uL (ref 0.0–0.1)
Basophils Relative: 1 %
Eosinophils Absolute: 0.2 10*3/uL (ref 0.0–0.5)
Eosinophils Relative: 5 %
HCT: 37.1 % (ref 36.0–46.0)
Hemoglobin: 12.3 g/dL (ref 12.0–15.0)
Immature Granulocytes: 0 %
Lymphocytes Relative: 48 %
Lymphs Abs: 2.1 10*3/uL (ref 0.7–4.0)
MCH: 27.6 pg (ref 26.0–34.0)
MCHC: 33.2 g/dL (ref 30.0–36.0)
MCV: 83.4 fL (ref 80.0–100.0)
Monocytes Absolute: 0.4 10*3/uL (ref 0.1–1.0)
Monocytes Relative: 9 %
Neutro Abs: 1.6 10*3/uL — ABNORMAL LOW (ref 1.7–7.7)
Neutrophils Relative %: 37 %
Platelets: 328 10*3/uL (ref 150–400)
RBC: 4.45 MIL/uL (ref 3.87–5.11)
RDW: 13.5 % (ref 11.5–15.5)
WBC: 4.3 10*3/uL (ref 4.0–10.5)
nRBC: 0 % (ref 0.0–0.2)

## 2023-01-24 LAB — COMPREHENSIVE METABOLIC PANEL
ALT: 69 U/L — ABNORMAL HIGH (ref 0–44)
AST: 43 U/L — ABNORMAL HIGH (ref 15–41)
Albumin: 4.4 g/dL (ref 3.5–5.0)
Alkaline Phosphatase: 112 U/L (ref 38–126)
Anion gap: 8 (ref 5–15)
BUN: 11 mg/dL (ref 8–23)
CO2: 27 mmol/L (ref 22–32)
Calcium: 9.9 mg/dL (ref 8.9–10.3)
Chloride: 105 mmol/L (ref 98–111)
Creatinine, Ser: 0.6 mg/dL (ref 0.44–1.00)
GFR, Estimated: >60 mL/min (ref >60)
Glucose, Bld: 99 mg/dL (ref 70–99)
Potassium: 3.8 mmol/L (ref 3.5–5.1)
Sodium: 140 mmol/L (ref 135–145)
Total Bilirubin: 0.4 mg/dL (ref 0.3–1.2)
Total Protein: 6.9 g/dL (ref 6.5–8.1)

## 2023-01-24 LAB — CBG MONITORING, ED: Glucose-Capillary: 85 mg/dL (ref 70–99)

## 2023-01-24 MED ORDER — IOHEXOL 350 MG/ML SOLN
100.0000 mL | Freq: Once | INTRAVENOUS | Status: AC | PRN
  Administered 2023-01-24: 80 mL via INTRAVENOUS

## 2023-01-24 MED ORDER — VALACYCLOVIR HCL 1 G PO TABS
1000.0000 mg | ORAL_TABLET | Freq: Three times a day (TID) | ORAL | 0 refills | Status: DC
  Filled 2023-01-24: qty 21, 7d supply, fill #0

## 2023-01-24 MED ORDER — PREDNISONE 20 MG PO TABS
60.0000 mg | ORAL_TABLET | Freq: Every day | ORAL | 0 refills | Status: AC
  Filled 2023-01-24: qty 21, 7d supply, fill #0

## 2023-01-24 NOTE — ED Provider Notes (Signed)
[place] [facility]GENC[facility] DEPARTMENT AT [facility] Provider Note   CSN: [id number] Arrival date & time: 01/24/23  2440     History  Chief Complaint  Patient presents with   Facial Droop    Shannon Vasquez is a 67 y.o. female.  Patient here with facial droop that occurred when she woke up this morning.  Went to bed last night at 11 feeling fine and then when she was drinking her coffee this morning she felt like she could not hold liquid in her mouth.  Family thinks maybe the left side of the face looked a little bit asymmetric to the right.  But patient denies any weakness numbness tingling or vision loss.  Just recently had COVID.  Has been having a mild headache the last few weeks as well but denies any chest pain shortness of breath.  Denies any speech changes or slurred speech.  History of high cholesterol.  History of skin cancer.  The history is provided by the patient and the spouse.       Home Medications Prior to Admission medications   Medication Sig Start Date End Date Taking? Authorizing Provider  predniSONE (DELTASONE) 20 MG tablet Take 3 tablets (60 mg total) by mouth daily for 7 days. 01/24/23 01/31/23 Yes Carrye Goller, DO  rosuvastatin (CRESTOR) 5 MG tablet Take 5 mg by mouth daily. 06/17/22  Yes [provider]  valACYclovir (VALTREX) 1000 MG tablet Take 1 tablet (1,000 mg total) by mouth 3 (three) times daily. 01/24/23  Yes Harjas Biggins, DO  benzonatate (TESSALON) 100 MG capsule Take 1 capsule (100 mg total) by mouth 3 (three) times daily as needed. 01/15/23   Ward, Tylene Fantasia, PA-C  Cholecalciferol (VITAMIN D3) 25 MCG (1000 UT) CAPS Take 1 capsule by mouth daily.    [provider]  ibuprofen (ADVIL) 200 MG tablet Take 1 tablet by mouth 3 times/day as needed-between meals & bedtime.    [provider]  Multiple Vitamin (ONE DAILY) tablet Take 1 tablet by mouth daily.    [provider]      Allergies    Penicillins     Review of Systems   Review of Systems  Physical Exam Updated Vital Signs BP 133/74   Pulse 71   Temp 97.9 F (36.6 C) (Oral)   Resp (!) 23   SpO2 95%  Physical Exam Vitals and nursing note reviewed.  Constitutional:      General: She is not in acute distress.    Appearance: She is well-developed.  HENT:     Head: Normocephalic and atraumatic.     Nose: Nose normal.     Mouth/Throat:     Mouth: Mucous membranes are moist.  Eyes:     Extraocular Movements: Extraocular movements intact.     Conjunctiva/sclera: Conjunctivae normal.     Pupils: Pupils are equal, round, and reactive to light.  Cardiovascular:     Rate and Rhythm: Normal rate and regular rhythm.     Pulses: Normal pulses.     Heart sounds: Normal heart sounds. No murmur heard. Pulmonary:     Effort: Pulmonary effort is normal. No respiratory distress.     Breath sounds: Normal breath sounds.  Abdominal:     Palpations: Abdomen is soft.     Tenderness: There is no abdominal tenderness.  Musculoskeletal:        General: No swelling.     Cervical back: Neck supple.  Skin:    General: Skin is  warm and dry.     Capillary Refill: Capillary refill takes less than 2 seconds.  Neurological:     Mental Status: She is alert.     Comments: 5+ out of 5 strength throughout, normal sensation, no drift, normal speech, normal visual fields, seems to have a little bit of incomplete closure to the right eye when compared to the left eye, maybe a little bit of a facial droop to the right compared to the left, may be slight weakness in the right side of the forehead compared to the left, normal finger-nose-finger, normal gait, normal coordination  Psychiatric:        Mood and Affect: Mood normal.     ED Results / Procedures / Treatments   Labs (all labs ordered are listed, but only abnormal results are displayed) Labs Reviewed  CBC WITH DIFFERENTIAL/PLATELET - Abnormal; Notable for the following components:      Result  Value   Neutro Abs 1.6 (*)    All other components within normal limits  COMPREHENSIVE METABOLIC PANEL - Abnormal; Notable for the following components:   AST 43 (*)    ALT 69 (*)    All other components within normal limits  CBG MONITORING, ED    EKG EKG Interpretation Date/Time:  Tuesday January 24 2023 09:23:28 EDT Ventricular Rate:  80 PR Interval:  208 QRS Duration:  85 QT Interval:  370 QTC Calculation: 427 R Axis:   -23  Text Interpretation: Sinus rhythm Borderline left axis deviation Low voltage, precordial leads Abnormal R-wave progression, late transition Confirmed by Virgina Norfolk [phone]) on 01/24/2023 10:18:57 AM  Radiology CT ANGIO HEAD NECK W WO CM  Result Date: 01/24/2023 CLINICAL DATA:  Mouth droop, stroke suspected EXAM: CT ANGIOGRAPHY HEAD AND NECK WITH AND WITHOUT CONTRAST TECHNIQUE: Multidetector CT imaging of the head and neck was performed using the standard protocol during bolus administration of intravenous contrast. Multiplanar CT image reconstructions and MIPs were obtained to evaluate the vascular anatomy. Carotid stenosis measurements (when applicable) are obtained utilizing NASCET criteria, using the distal internal carotid diameter as the denominator. RADIATION DOSE REDUCTION: This exam was performed according to the departmental dose-optimization program which includes automated exposure control, adjustment of the mA and/or kV according to patient size and/or use of iterative reconstruction technique. CONTRAST:  80mL OMNIPAQUE IOHEXOL 350 MG/ML SOLN COMPARISON:  None Available. FINDINGS: CT HEAD FINDINGS Brain: No evidence of acute infarct, hemorrhage, mass, mass effect, or midline shift. No hydrocephalus or extra-axial fluid collection. Vascular: No hyperdense vessel. Skull: Negative for fracture or focal lesion. Sinuses/Orbits: Mucosal thickening in the inferior right maxillary sinus. No acute finding in the orbits. Other: The mastoid air cells are well  aerated. CTA NECK FINDINGS Aortic arch: Standard branching. Imaged portion shows no evidence of aneurysm or dissection. No significant stenosis of the major arch vessel origins. Right carotid system: No evidence of stenosis, dissection, or occlusion. Left carotid system: No evidence of stenosis, dissection, or occlusion. Vertebral arteries: No evidence of stenosis, dissection, or occlusion. Skeleton: No acute osseous abnormality. Degenerative changes in the cervical spine. Other neck: No acute finding. Upper chest: No focal pulmonary opacity or pleural effusion. Review of the MIP images confirms the above findings CTA HEAD FINDINGS Anterior circulation: Both internal carotid arteries are patent to the termini, with calcification but without significant stenosis. A1 segments patent. Normal anterior communicating artery. Anterior cerebral arteries are patent to their distal aspects without significant stenosis. No M1 stenosis or occlusion. MCA branches perfused to their  distal aspects without significant stenosis. Posterior circulation: Vertebral arteries patent to the vertebrobasilar junction without significant stenosis. Posterior inferior cerebellar arteries patent proximally. Basilar patent to its distal aspect without significant stenosis. Superior cerebellar arteries patent proximally. Patent P1 segments. PCAs perfused to their distal aspects without significant stenosis. The bilateral posterior communicating arteries are not visualized. Venous sinuses: As permitted by contrast timing, patent. Anatomic variants: None significant. No evidence of aneurysm or vascular malformation. Review of the MIP images confirms the above findings IMPRESSION: 1. No acute intracranial process. 2. No intracranial large vessel occlusion or significant stenosis. 3. No hemodynamically significant stenosis in the neck. Electronically Signed   By: Wiliam Ke M.D.   On: 01/24/2023 12:52    Procedures Procedures    Medications  Ordered in ED Medications  iohexol (OMNIPAQUE) 350 MG/ML injection 100 mL (80 mLs Intravenous Contrast Given 01/24/23 1048)    ED Course/ Medical Decision Making/ A&P                                 Medical Decision Making Amount and/or Complexity of Data Reviewed Labs: ordered. Radiology: ordered.  Risk Prescription drug management.   Karlene Hoselton is here with facial droop.  Symptoms started when she first woke up this morning.  Went to bed feeling okay last night.  She does not have any major medical problems For high cholesterol and history of skin cancer.  Family thought maybe the left side of her face was droopy but she felt like it was the right side and ultimately to me on exam looks like she has a partial Bell's palsy on the right.  She has incomplete closure of her right eye compared to her left, she seems to have a little bit of right lower facial weakness compared to left.  Forehead muscles appear to be okay but I do see a little bit of subtle decrease strength in the right side of the forehead as well.  Her eyelid lag certainly seems to be the most prominent thing that makes me think that this is a Bell's palsy.  She has no other stroke symptoms otherwise.  She has normal strength and sensation throughout otherwise.  She has normal visual fields, normal speech, normal coordination.  She just had COVID about a week and a half ago as well which could be a trigger for her symptoms today.  I talked with neurology given that exam was not a complete Bell's palsy and ultimately will do a CTA of the head and neck to evaluate for any structural processes but given the history and physical conservative management with antiviral and steroids likely the plan if imaging is unremarkable.  I do not think we need to pursue MRI at this time to rule out stroke but will reevaluate after lab work and imaging and have discussion with her about this as well.  Per my review and interpretation labs no  significant anemia or electrolyte abnormality kidney injury or leukocytosis.  CT scan of the head and neck shows no acute intracranial process or large vessel occlusion or mass per radiology report.  Overall I do suspect that she has a Bell's palsy.  I will prescribe her prednisone and Valtrex.  She has been given information about eye care.  Will have her follow-up with ophthalmology and neurology.  She understands return precautions.  Discharged in good condition.  This chart was dictated using voice recognition software.  Despite best efforts to proofread,  errors can occur which can change the documentation meaning.         Final Clinical Impression(s) / ED Diagnoses Final diagnoses:  Bell palsy    Rx / DC Orders ED Discharge Orders          Ordered    Ambulatory referral to Neurology       Comments: An appointment is requested in approximately: 1 week   01/24/23 1023    valACYclovir (VALTREX) 1000 MG tablet  3 times daily        01/24/23 1240    predniSONE (DELTASONE) 20 MG tablet  Daily        01/24/23 1240              Ronica Vivian, Madelaine Bhat, DO 01/24/23 1304

## 2023-01-24 NOTE — Discharge Instructions (Addendum)
CT scans today were unremarkable.  There is no acute evidence of stroke or other intracranial process at this time.  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  Take medications as prescribed.  Recommend buying eyedrops to keep your eye moist.  Recommend taping your eye shut at night as we discussed.  Follow-up with neurology and eye doctor and your primary care doctor.  Return if symptoms worsen as discussed.

## 2023-01-24 NOTE — ED Triage Notes (Signed)
Pt c/o mouth droop at 0730 today. LKW at 111pm last night. Speech clear, AO x 4. Bilaterally equal UE and LE. Also reports posterior HA radiating RT side of head

## 2023-02-02 DIAGNOSIS — Z6827 Body mass index (BMI) 27.0-27.9, adult: Secondary | ICD-10-CM | POA: Diagnosis not present

## 2023-02-02 DIAGNOSIS — G510 Bell's palsy: Secondary | ICD-10-CM | POA: Diagnosis not present

## 2023-02-07 ENCOUNTER — Ambulatory Visit: Payer: BC Managed Care – PPO | Admitting: Neurology

## 2023-02-28 ENCOUNTER — Ambulatory Visit: Payer: BC Managed Care – PPO | Admitting: Neurology

## 2023-02-28 DIAGNOSIS — L578 Other skin changes due to chronic exposure to nonionizing radiation: Secondary | ICD-10-CM | POA: Diagnosis not present

## 2023-02-28 DIAGNOSIS — D225 Melanocytic nevi of trunk: Secondary | ICD-10-CM | POA: Diagnosis not present

## 2023-02-28 DIAGNOSIS — L814 Other melanin hyperpigmentation: Secondary | ICD-10-CM | POA: Diagnosis not present

## 2023-02-28 DIAGNOSIS — L821 Other seborrheic keratosis: Secondary | ICD-10-CM | POA: Diagnosis not present

## 2023-02-28 DIAGNOSIS — D485 Neoplasm of uncertain behavior of skin: Secondary | ICD-10-CM | POA: Diagnosis not present

## 2023-02-28 DIAGNOSIS — Z85828 Personal history of other malignant neoplasm of skin: Secondary | ICD-10-CM | POA: Diagnosis not present

## 2023-02-28 DIAGNOSIS — Z08 Encounter for follow-up examination after completed treatment for malignant neoplasm: Secondary | ICD-10-CM | POA: Diagnosis not present

## 2023-04-10 DIAGNOSIS — R2242 Localized swelling, mass and lump, left lower limb: Secondary | ICD-10-CM | POA: Diagnosis not present

## 2023-04-18 ENCOUNTER — Encounter: Payer: Self-pay | Admitting: Neurology

## 2023-04-18 ENCOUNTER — Ambulatory Visit: Payer: Medicare HMO | Admitting: Neurology

## 2023-04-18 VITALS — BP 156/88 | HR 68 | Ht 63.25 in

## 2023-04-18 DIAGNOSIS — G510 Bell's palsy: Secondary | ICD-10-CM | POA: Diagnosis not present

## 2023-04-18 NOTE — Patient Instructions (Signed)
Continue to follow up with PCP  Return as needed  

## 2023-04-18 NOTE — Progress Notes (Signed)
[facility]  PATIENT: Shannon Vasquez DOB: [birth year]  REQUESTING CLINICIAN: Virgina Norfolk, DO HISTORY FROM: Patient  REASON FOR VISIT: Right sided Bell's Palsy    HISTORICAL  CHIEF COMPLAINT:  Chief Complaint  Patient presents with   New Patient (Initial Visit)    Rm12, alone,  internal referral for Bell's Palsy: currently experences right eye twitching. Everything else stopped    HISTORY OF PRESENT ILLNESS:  This is 68 year old woman past medical history of hyperlipidemia who is presenting for follow-up after being diagnosed with Bell's palsy in October 2024.  Patient reports that morning she woke up, noticed that she was drooling while drinking her coffee.  She checked in the mirror and had right facial drooping therefore presented to the hospital to rule out stroke.  In the ED she had symptoms consistent for Bell's palsy.  Her CT head and angiogram did not show any acute abnormalities.  She was treated with prednisone and antiviral.  She completed treatment and currently is almost back to her baseline.  She still have a very mild right facial drooping but otherwise no other symptoms from the Bells Palsy.  She also reports on occasion she will have tingling on the right eye. She denies any previous history of Bells Palsy but reports the week before, she was battling with COVID.     OTHER MEDICAL CONDITIONS: Hyperlipidemia    REVIEW OF SYSTEMS: Full 14 system review of systems performed and negative with exception of: As noted in the HPI   ALLERGIES: Allergies  Allergen Reactions   Levonorgestrel-Ethinyl Estrad     Other Reaction(s): eye swelling   Penicillin G Sodium     Other Reaction(s): Unknown   Penicillins     Other Reaction(s): Not available    HOME MEDICATIONS: Outpatient Medications Prior to Visit  Medication Sig Dispense Refill   Cholecalciferol (VITAMIN D3) 25 MCG (1000 UT) CAPS Take 1 capsule by mouth daily.     ibuprofen (ADVIL)  200 MG tablet Take 1 tablet by mouth 3 times/day as needed-between meals & bedtime.     Multiple Vitamin (ONE DAILY) tablet Take 1 tablet by mouth daily.     rosuvastatin (CRESTOR) 5 MG tablet Take 5 mg by mouth daily.     benzonatate (TESSALON) 100 MG capsule Take 1 capsule (100 mg total) by mouth 3 (three) times daily as needed. 20 capsule 0   valACYclovir (VALTREX) 1000 MG tablet Take 1 tablet (1,000 mg total) by mouth 3 (three) times daily. 21 tablet 0   No facility-administered medications prior to visit.    PAST MEDICAL HISTORY: Past Medical History:  Diagnosis Date   Allergy    Arthritis    Arthritis    Cancer (HCC)    carcinoma   Chest pain    Heart murmur    Hyperlipidemia    Osteopenia     PAST SURGICAL HISTORY: Past Surgical History:  Procedure Laterality Date   ABDOMINAL HYSTERECTOMY     FOOT SURGERY     JOINT REPLACEMENT     MOHS SURGERY     on lip    FAMILY HISTORY: Family History  Problem Relation Age of Onset   Thyroid disease Mother    Arthritis Mother    Arthritis Father    Arthritis Maternal Grandmother    Heart disease Maternal Grandfather    Arthritis Maternal Grandfather    Arthritis Paternal Grandmother    Other Paternal Grandmother        FAMILY HX  OF CANCER   Heart disease Paternal Grandfather    Arthritis Paternal Grandfather     SOCIAL HISTORY: Social History   Socioeconomic History   Marital status: Married    Spouse name: Not on file   Number of children: Not on file   Years of education: Not on file   Highest education level: Not on file  Occupational History   Not on file  Tobacco Use   Smoking status: Never   Smokeless tobacco: Never  Substance and Sexual Activity   Alcohol use: Yes   Drug use: No   Sexual activity: Not on file  Other Topics Concern   Not on file  Social History Narrative   Not on file   Social Drivers of Health   Financial Resource Strain: Not on file  Food Insecurity: Not on file   Transportation Needs: Not on file  Physical Activity: Not on file  Stress: Not on file  Social Connections: Not on file  Intimate Partner Violence: Not on file    PHYSICAL EXAM  GENERAL EXAM/CONSTITUTIONAL: Vitals:  Vitals:   04/18/23 1101  BP: (!) 156/88  Pulse: 68  Height: 5' 3.25" (1.607 m)   Body mass index is 27.24 kg/m. Wt Readings from Last 3 Encounters:  08/11/20 155 lb (70.3 kg)  04/03/16 156 lb (70.8 kg)  01/04/16 160 lb 8 oz (72.8 kg)   Patient is in no distress; well developed, nourished and groomed; neck is supple  MUSCULOSKELETAL: Gait, strength, tone, movements noted in Neurologic exam below  NEUROLOGIC: MENTAL STATUS:      No data to display         awake, alert, oriented to person, place and time recent and remote memory intact normal attention and concentration language fluent, comprehension intact, naming intact fund of knowledge appropriate  CRANIAL NERVE:  2nd, 3rd, 4th, 6th - Visual fields full to confrontation, extraocular muscles intact, no nystagmus 5th - facial sensation symmetric 7th - Mild right lower facial droop but symmetric on activation 8th - hearing intact 9th - palate elevates symmetrically, uvula midline 11th - shoulder shrug symmetric 12th - tongue protrusion midline  MOTOR:  normal bulk and tone, full strength in the BUE, BLE  SENSORY:  normal and symmetric to light touch  COORDINATION:  finger-nose-finger, fine finger movements normal  GAIT/STATION:  normal     DIAGNOSTIC DATA (LABS, IMAGING, TESTING) - I reviewed patient records, labs, notes, testing and imaging myself where available.  Lab Results  Component Value Date   WBC 4.3 01/24/2023   HGB 12.3 01/24/2023   HCT 37.1 01/24/2023   MCV 83.4 01/24/2023   PLT 328 01/24/2023      Component Value Date/Time   NA 140 01/24/2023 0937   K 3.8 01/24/2023 0937   CL 105 01/24/2023 0937   CO2 27 01/24/2023 0937   GLUCOSE 99 01/24/2023 0937   BUN 11  01/24/2023 0937   CREATININE 0.60 01/24/2023 0937   CALCIUM 9.9 01/24/2023 0937   PROT 6.9 01/24/2023 0937   ALBUMIN 4.4 01/24/2023 0937   AST 43 (H) 01/24/2023 0937   ALT 69 (H) 01/24/2023 0937   ALKPHOS 112 01/24/2023 0937   BILITOT 0.4 01/24/2023 0937   GFRNONAA >60 01/24/2023 0937   GFRAA >60 04/03/2016 1214   No results found for: "CHOL", "HDL", "LDLCALC", "LDLDIRECT", "TRIG", "CHOLHDL" No results found for: "HGBA1C" No results found for: "VITAMINB12" No results found for: "TSH"  CT Head, CTA Head and Neck 01/24/2023 1. No acute  intracranial process. 2. No intracranial large vessel occlusion or significant stenosis. 3. No hemodynamically significant stenosis in the neck.   ASSESSMENT AND PLAN  68 y.o. year old female with history of hyperlipidemia who is presenting for follow-up after being diagnosed with Bell's palsy in October 2024.  Patient completed treatment with steroid and antiviral, currently almost back to her baseline except for right lower facial weakness.  I confirmed the diagnosis with patient, explained to her that most patient with Bell's palsy will have complete recovery at the 1 year mark.  At this point, she already completed treatment and she should continue doing facial exercises and continue to follow with PCP.  Return as needed.   1. Bell's palsy      Patient Instructions  Continue to follow up with PCP  Return as needed   No orders of the defined types were placed in this encounter.   No orders of the defined types were placed in this encounter.   Return if symptoms worsen or fail to improve.    Windell Norfolk, MD 04/18/2023, 11:20 AM  [facility] [address] [place], [place] [address] [phone]

## 2023-06-07 DIAGNOSIS — Z08 Encounter for follow-up examination after completed treatment for malignant neoplasm: Secondary | ICD-10-CM | POA: Diagnosis not present

## 2023-06-07 DIAGNOSIS — I788 Other diseases of capillaries: Secondary | ICD-10-CM | POA: Diagnosis not present

## 2023-06-07 DIAGNOSIS — Z09 Encounter for follow-up examination after completed treatment for conditions other than malignant neoplasm: Secondary | ICD-10-CM | POA: Diagnosis not present

## 2023-06-07 DIAGNOSIS — L578 Other skin changes due to chronic exposure to nonionizing radiation: Secondary | ICD-10-CM | POA: Diagnosis not present

## 2023-06-07 DIAGNOSIS — Z85828 Personal history of other malignant neoplasm of skin: Secondary | ICD-10-CM | POA: Diagnosis not present

## 2023-06-20 DIAGNOSIS — E7800 Pure hypercholesterolemia, unspecified: Secondary | ICD-10-CM | POA: Diagnosis not present

## 2023-06-20 DIAGNOSIS — E559 Vitamin D deficiency, unspecified: Secondary | ICD-10-CM | POA: Diagnosis not present

## 2023-06-20 DIAGNOSIS — Z79899 Other long term (current) drug therapy: Secondary | ICD-10-CM | POA: Diagnosis not present

## 2023-06-20 DIAGNOSIS — M25552 Pain in left hip: Secondary | ICD-10-CM | POA: Diagnosis not present

## 2023-09-14 DIAGNOSIS — Z0100 Encounter for examination of eyes and vision without abnormal findings: Secondary | ICD-10-CM | POA: Diagnosis not present

## 2023-09-14 DIAGNOSIS — H2513 Age-related nuclear cataract, bilateral: Secondary | ICD-10-CM | POA: Diagnosis not present

## 2023-09-26 DIAGNOSIS — E7800 Pure hypercholesterolemia, unspecified: Secondary | ICD-10-CM | POA: Diagnosis not present

## 2023-09-26 DIAGNOSIS — Z6828 Body mass index (BMI) 28.0-28.9, adult: Secondary | ICD-10-CM | POA: Diagnosis not present

## 2023-09-26 DIAGNOSIS — Z0000 Encounter for general adult medical examination without abnormal findings: Secondary | ICD-10-CM | POA: Diagnosis not present

## 2023-11-20 DIAGNOSIS — Z01419 Encounter for gynecological examination (general) (routine) without abnormal findings: Secondary | ICD-10-CM | POA: Diagnosis not present

## 2023-11-20 DIAGNOSIS — Z6828 Body mass index (BMI) 28.0-28.9, adult: Secondary | ICD-10-CM | POA: Diagnosis not present

## 2023-11-20 DIAGNOSIS — Z13818 Encounter for screening for other digestive system disorders: Secondary | ICD-10-CM | POA: Diagnosis not present

## 2023-11-20 DIAGNOSIS — R232 Flushing: Secondary | ICD-10-CM | POA: Diagnosis not present

## 2024-01-09 DIAGNOSIS — Z1231 Encounter for screening mammogram for malignant neoplasm of breast: Secondary | ICD-10-CM | POA: Diagnosis not present

## 2024-01-29 DIAGNOSIS — E7800 Pure hypercholesterolemia, unspecified: Secondary | ICD-10-CM | POA: Diagnosis not present
